# Patient Record
Sex: Female | Born: 1970 | Race: White | Hispanic: No | Marital: Married | Smoking: Never smoker
Health system: Southern US, Community
[De-identification: ages and names within clinical notes are randomized; demographics above are authoritative.]

## PROBLEM LIST (undated history)

## (undated) DIAGNOSIS — J309 Allergic rhinitis, unspecified: Secondary | ICD-10-CM

## (undated) DIAGNOSIS — J45909 Unspecified asthma, uncomplicated: Secondary | ICD-10-CM

## (undated) DIAGNOSIS — F419 Anxiety disorder, unspecified: Secondary | ICD-10-CM

## (undated) HISTORY — DX: Anxiety disorder, unspecified: F41.9

## (undated) HISTORY — DX: Allergic rhinitis, unspecified: J30.9

---

## 2003-09-05 HISTORY — PX: CHOLECYSTECTOMY: SHX55

## 2011-09-05 HISTORY — PX: OTHER SURGICAL HISTORY: SHX169

## 2018-07-04 ENCOUNTER — Encounter: Payer: Self-pay | Admitting: Allergy

## 2018-07-04 ENCOUNTER — Ambulatory Visit: Payer: BC Managed Care – PPO | Admitting: Allergy

## 2018-07-04 VITALS — BP 100/60 | HR 78 | Temp 97.8°F | Resp 16 | Ht 67.0 in | Wt 135.6 lb

## 2018-07-04 DIAGNOSIS — H1013 Acute atopic conjunctivitis, bilateral: Secondary | ICD-10-CM | POA: Diagnosis not present

## 2018-07-04 DIAGNOSIS — J988 Other specified respiratory disorders: Secondary | ICD-10-CM | POA: Diagnosis not present

## 2018-07-04 DIAGNOSIS — J3089 Other allergic rhinitis: Secondary | ICD-10-CM

## 2018-07-04 DIAGNOSIS — J4520 Mild intermittent asthma, uncomplicated: Secondary | ICD-10-CM | POA: Diagnosis not present

## 2018-07-04 MED ORDER — FLUTICASONE PROPIONATE 93 MCG/ACT NA EXHU: 2.0000 | INHALANT_SUSPENSION | Freq: Two times a day (BID) | NASAL | 5 refills | Status: DC

## 2018-07-04 MED ORDER — ALBUTEROL SULFATE HFA 108 (90 BASE) MCG/ACT IN AERS: 2.0000 | INHALATION_SPRAY | RESPIRATORY_TRACT | 1 refills | Status: DC | PRN

## 2018-07-04 MED ORDER — OLOPATADINE HCL 0.7 % OP SOLN: 1.0000 [drp] | Freq: Every day | OPHTHALMIC | 5 refills | Status: DC

## 2018-07-04 NOTE — Progress Notes (Signed)
New Patient Note  RE: Kelsey Benjamin MRN: [id number] DOB: Jan 19, 1971 Date of Office Visit: 07/04/2018  Referring provider: No ref. provider found Primary care provider: Isabella Bowens, PA-C  Chief Complaint: allergies  History of present illness: Kelsey Benjamin is a 47 y.o. female presenting today for evaluation of allergies.  She is former patient of our practice with last visit in 2008.  She returns today to re-establish care and discuss options of restarting immunotherapy.    She was seeing and ENT here in [place] and was undergoing allergy shots for about 3 years.  She states she was advised she could stop after 3 years (Sept 2018) and she states symptoms have been worse since she stopped immunotherapy.   She states symptoms were controlled while on immunotherapy and medications.   She reports symptoms of itchy/watery eyes, runny/stuffy nose and sneezing. She also reports having itchy rash mostly on her neck that is associated with her allergies.  Symptoms are year-round.  She had been taking Zyrtec for the past several years and believes it was no longer working.  She has tried Careers adviser and claritin in the past as well without much benefit. She has been on singulair in the past with last use around 1.5-2 years ago.   Has used flonase with some relief of nasal symptoms.  Also has used Pataday. She states the medications were not very helpful alone but combined with immunotherapy was effective.   She reports she "stays sick" with sinus infections and ear infections that she usually has at the same time.  She feels she would get these infections about 3 times a year at least that is treated with 1 round of antibiotic.  She has required prednisone with several of the infections.  Denies history of PNA, no skin infections, no opportunistic infections or needed for IV antibiotics.     She has used proair in the past for respiratory illnesses.  She states with most recent illness last week  did need to use albuterol for cough and wheezing.  Albuterol does help.  Denies symptoms inbetween illnesses.  She has never been diagnosed with asthma.    No history of eczema or food allergy.    Review of systems: Review of Systems  Constitutional: Negative for chills, fever and malaise/fatigue.  HENT: Positive for congestion. Negative for ear discharge, ear pain, nosebleeds, sinus pain and sore throat.   Eyes: Negative for pain, discharge and redness.  Respiratory: Positive for cough and wheezing. Negative for sputum production and shortness of breath.   Cardiovascular: Negative for chest pain.  Gastrointestinal: Negative for abdominal pain, constipation, diarrhea, heartburn, nausea and vomiting.  Musculoskeletal: Negative for joint pain.  Skin: Positive for itching and rash.  Neurological: Negative for headaches.    All other systems negative unless noted above in HPI  Past medical history: Past Medical History:  Diagnosis Date  . Allergic rhinitis   . Anxiety     Past surgical history: Past Surgical History:  Procedure Laterality Date  . CHOLECYSTECTOMY  2005  . uterine ablation  2013    Family history:  Family History  Problem Relation Age of Onset  . Allergic rhinitis Son   . Asthma Son   . Angioedema Neg Hx   . Eczema Neg Hx   . Immunodeficiency Neg Hx   . Urticaria Neg Hx     Social history: Lives in a home with carpeting with electric heating and central cooling.  Dogs in the home.  No concern for water damage, mildew or roaches in the home.  She is a Runner, broadcasting/film/video.  Denies smoking history.    Medication List: Allergies as of 07/04/2018      Reactions   Doxycycline Rash      Medication List        Accurate as of 07/04/18  4:12 PM. Always use your most recent med list.          amoxicillin-clavulanate 875-125 MG tablet Commonly known as:  AUGMENTIN Take by mouth.   cetirizine 10 MG tablet Commonly known as:  ZYRTEC Take by mouth.   citalopram 40  MG tablet Commonly known as:  CELEXA Take by mouth.   fluticasone 50 MCG/ACT nasal spray Commonly known as:  FLONASE 2 sprays in each nostril daily x1 week then 1-2 sprays in each nostril daily. (use lowest possible dose after week 1)   levocetirizine 5 MG tablet Commonly known as:  XYZAL Take by mouth.       Known medication allergies: Allergies  Allergen Reactions  . Doxycycline Rash     Physical examination: Blood pressure 100/60, pulse 78, temperature 97.8 F (36.6 C), temperature source Oral, resp. rate 16, height 5\' 7"  (1.702 m), weight 135 lb 9.3 oz (61.5 kg), SpO2 95 %.  General: Alert, interactive, in no acute distress. HEENT: PERRLA, TMs pearly gray, turbinates moderately edematous with clear discharge, post-pharynx non erythematous. Neck: Supple without lymphadenopathy. Lungs: Clear to auscultation without wheezing, rhonchi or rales. {no increased work of breathing. CV: Normal S1, S2 without murmurs. Abdomen: Nondistended, nontender. Skin: Warm and dry, without lesions or rashes. Extremities:  No clubbing, cyanosis or edema. Neuro:   Grossly intact.  Diagnositics/Labs: Labs: CBC and Differential (07/02/2018 10:58 AM EDT) Component Value Ref Range  WBC 6.9 4.8 - 10.8 x 10*3/uL  RBC 4.08 (L) 4.20 - 5.40 x 10*6/uL  Hemoglobin 13.0 12.0 - 16.0 G/DL  Hematocrit 82.9 56.2 - 47.0 %  MCV 93.3 81.0 - 99.0 FL  MCH 31.8 (H) 27.0 - 31.0 PG  MCHC 34.1 33.0 - 37.0 G/DL  RDW 13.0 86.5 - 78.4 %  Platelets 286 160 - 360 X 10*3/uL  MPV 6.6 (L) 6.8 - 10.2 FL  Neutrophil % 61 %  Lymphocyte % 28 %  Monocyte % 6 %  Eosinophil % 5 %  Basophil % 1 %  Neutrophil Absolute 4.2 1.6 - 7.3 x 10*3/uL  Lymphocyte Absolute 1.9 1.0 - 5.1 x 10*3/uL  Monocyte Absolute 0.4 0.1 - 0.9 x 10*3/uL  Eosinophil Absolute 0.3 0.0 - 0.5 x 10*3/uL  Basophil Absolute 0.0 0.0 - 0.2 x 10*3/uL   Comprehensive Metabolic Panel (07/02/2018 10:58 AM EDT) Component Value Ref Range  Sodium 139 135 -  146 MMOL/L  Potassium 3.9 3.5 - 5.3 MMOL/L  Chloride 104 98 - 110 MMOL/L  CO2 29 23 - 30 MMOL/L  BUN 11 8 - 24 MG/DL  Glucose 88 70 - 99 MG/DL  Creatinine 6.96 2.95 - 1.50 MG/DL  Calcium 9.0 8.5 - 28.4 MG/DL  Total Protein 6.4 6.0 - 8.3 G/DL  Albumin  4.0 3.5 - 5.0 G/DL  Total Bilirubin 0.5 0.1 - 1.2 MG/DL  Alkaline Phosphatase 62 25 - 125 IU/L or U/L  AST (SGOT) 10 5 - 40 IU/L or U/L  ALT (SGPT) 7 5 - 50 IU/L or U/L  Anion Gap 6 4 - 14 MMOL/L  Est. GFR Non-African American >=90Comment: GFR estimated by CKD-EPI equations, reportable up to 90 ML/MIN/1.73 M*2 >=60 ML/MIN/1.73 M*2  Est. GFR African American >=90Comment: GFR estimated by CKD-EPI equations, reportable up to 90 ML/MIN/1.73 M*2 >=60 ML/MIN/1.73 M*2     Spirometry: FEV1: 3.47L 109%, FVC: 4.04L 102%, ratio consistent with nonobstructive pattern   Environmental allergy skin prick testing is positive to grass and weed pollen, mold and dust mites.  Intradermal testing is negative.     Assessment and plan:   Allergies with dermatitis   - environmental allergy skin testing today is positive to grass pollen, weed pollen, mold and dust mites.     - will obtain environmental allergy to ensure we have captured all you are reactive too as she states previous testing was positive to additional allergens.  It is likely that her previous immunotherapy may have    - allergen avoidance measures discussed/handouts provided   - start Xyzal 5mg  daily   - for itchy/watery/red eyes try use of Pazeo 1 drop each eye daily as needed   - for nasal congestion recommend use of Xhance (fluticasone) nasal device.  This nasal spray allows for deeper deposition of medication into the sinuses to have better effect.  Believe this would be effective for you given history of sinus infections.    - would resume Singulair 10mg  daily (take at bedtime).   - allergen immunotherapy discussed today including protocol, benefits and risk.  Informational handout  provided.  If interested in this therapuetic option you can check with your insurance carrier for coverage.  Let us know if you would like to proceed with this option.  Last injections in [place] office given on Mon, Wed, Thurs 4:30; Tues 5:30 and Fri 3:30.      - we also discussed option of sublingual immunotherapy for grass, ragweed or dust mites.    Wheezing with illness   - likely asthmatic bronchitis   - have access to albuterol inhaler 2 puffs every 4-6 hours as needed for cough/wheeze/shortness of breath/chest tightness.  May use 15-20 minutes prior to activity.   Monitor frequency of use.    Recurrent sinopulmonary infections   - multiple episodes of sinus infections and ear infections per year   - will complete immunocompetence work-up with immunoglobulins and vaccine titers  Follow-up 4-6 months or sooner if needed  I appreciate the opportunity to take part in Renee's care. Please do not hesitate to contact me with questions.  Sincerely,   Margo Aye, MD Allergy/Immunology [facility]

## 2018-07-04 NOTE — Patient Instructions (Addendum)
Allergies with dermatitis   - environmental allergy skin testing today is positive to grass pollen, weed pollen, mold and dust mites.     - will obtain environmental allergy to ensure we have captured all you are reactive too as she states previous testing was positive to additional allergens.  It is likely that her previous immunotherapy may have    - allergen avoidance measures discussed/handouts provided   - start Xyzal 5mg  daily   - for itchy/watery/red eyes try use of Pazeo 1 drop each eye daily as needed   - for nasal congestion recommend use of Xhance (fluticasone) nasal device.  This nasal spray allows for deeper deposition of medication into the sinuses to have better effect.  Believe this would be effective for you given history of sinus infections.    - would resume Singulair 10mg  daily (take at bedtime).   - allergen immunotherapy discussed today including protocol, benefits and risk.  Informational handout provided.  If interested in this therapuetic option you can check with your insurance carrier for coverage.  Let us know if you would like to proceed with this option.  Last injections in [place] office given on Mon, Wed, Thurs 4:30; Tues 5:30 and Fri 3:30.      - we also discussed option of sublingual immunotherapy for grass, ragweed or dust mites.    Wheezing with illness   - likely asthmatic bronchitis   - have access to albuterol inhaler 2 puffs every 4-6 hours as needed for cough/wheeze/shortness of breath/chest tightness.  May use 15-20 minutes prior to activity.   Monitor frequency of use.    Recurrent sinopulmonary infections   - multiple episodes of sinus infections and ear infections per year   - will complete immunocompetence work-up with immunoglobulins and vaccine titers  Follow-up 4-6 months or sooner if needed

## 2018-07-04 NOTE — Addendum Note (Signed)
Addended by: Vincent Peyer A on: 07/04/2018 04:33 PM   Modules accepted: Orders

## 2018-07-18 ENCOUNTER — Telehealth: Payer: Self-pay

## 2018-07-18 MED ORDER — EPINEPHRINE 0.3 MG/0.3ML IJ SOAJ: INTRAMUSCULAR | 1 refills | Status: DC

## 2018-07-18 NOTE — Telephone Encounter (Signed)
Spoke to pt. About her blood work. She states she can't do the zone 2 that test is 200.00 dollars or the IA-INF-Qnt nos AB-Qnt-nos and this test is 377.00. The other blood work is more reasonable, but Dr. Delorse LekPadgett said she could hold off on the blood work, but when she meets her deductible the immuno blood work up she would like pt. To get. I will inform the pt. To keep track of any infections and antibiotics she has taken. Per Dr. Delorse LekPadgett. Pt. Is scheduled for immunotherapy on December 11th at 4:00 pm.

## 2018-07-18 NOTE — Telephone Encounter (Signed)
Pt. Calling to let us know that her blood work was going to cost her 653.00. So the pt. Did not get her blood work done. Please advise.

## 2018-07-18 NOTE — Telephone Encounter (Signed)
Ok.  Her insurance must not have kicked in anything.  Her labwork consisted of environmental panel, vaccine titers, immunoglobin and cbc.

## 2018-07-23 NOTE — Addendum Note (Signed)
Addended by: Lorrin MaisPADGETT, SHAYLAR P on: 07/23/2018 08:39 AM   Modules accepted: Orders

## 2018-07-23 NOTE — Progress Notes (Signed)
VIALS EXP 07-24-19

## 2018-07-26 DIAGNOSIS — J3089 Other allergic rhinitis: Secondary | ICD-10-CM | POA: Diagnosis not present

## 2018-08-14 ENCOUNTER — Ambulatory Visit: Payer: Self-pay

## 2018-08-15 ENCOUNTER — Ambulatory Visit (INDEPENDENT_AMBULATORY_CARE_PROVIDER_SITE_OTHER): Payer: BC Managed Care – PPO | Admitting: *Deleted

## 2018-08-15 DIAGNOSIS — J309 Allergic rhinitis, unspecified: Secondary | ICD-10-CM | POA: Diagnosis not present

## 2018-08-15 NOTE — Progress Notes (Signed)
Immunotherapy   Patient Details  Name: Kelsey GoldmannDanette Benjamin MRN: [id number] Date of Birth: 08/15/1971  08/15/2018  Kelsey Goldmannanette Benjamin started injections for  blue vial pollen-dustmite Following schedule: B  Frequency:2 times per week Epi-Pen:Epi-Pen Available  Consent signed and patient instructions given.   Maurine SimmeringLogan D Freeman 08/15/2018, 3:02 PM

## 2018-08-22 ENCOUNTER — Ambulatory Visit (INDEPENDENT_AMBULATORY_CARE_PROVIDER_SITE_OTHER): Payer: BC Managed Care – PPO

## 2018-08-22 DIAGNOSIS — J309 Allergic rhinitis, unspecified: Secondary | ICD-10-CM | POA: Diagnosis not present

## 2018-08-26 ENCOUNTER — Ambulatory Visit (INDEPENDENT_AMBULATORY_CARE_PROVIDER_SITE_OTHER): Payer: BC Managed Care – PPO

## 2018-08-26 DIAGNOSIS — J309 Allergic rhinitis, unspecified: Secondary | ICD-10-CM

## 2018-08-30 ENCOUNTER — Other Ambulatory Visit: Payer: Self-pay | Admitting: Allergy

## 2018-08-30 ENCOUNTER — Ambulatory Visit (INDEPENDENT_AMBULATORY_CARE_PROVIDER_SITE_OTHER): Payer: BC Managed Care – PPO

## 2018-08-30 DIAGNOSIS — J309 Allergic rhinitis, unspecified: Secondary | ICD-10-CM | POA: Diagnosis not present

## 2018-08-30 MED ORDER — MONTELUKAST SODIUM 10 MG PO TABS: 10.0000 mg | ORAL_TABLET | Freq: Every day | ORAL | 5 refills | Status: DC

## 2018-08-30 NOTE — Telephone Encounter (Signed)
Patient came in office today to tell us that Dr Delorse LekPadgett did not send the prescription for singulair like she said she would from the office visit.

## 2018-08-30 NOTE — Telephone Encounter (Signed)
Refill for Singulair sent to [facility]

## 2018-09-03 ENCOUNTER — Ambulatory Visit (INDEPENDENT_AMBULATORY_CARE_PROVIDER_SITE_OTHER): Payer: BC Managed Care – PPO

## 2018-09-03 DIAGNOSIS — J309 Allergic rhinitis, unspecified: Secondary | ICD-10-CM

## 2018-09-12 ENCOUNTER — Ambulatory Visit (INDEPENDENT_AMBULATORY_CARE_PROVIDER_SITE_OTHER): Payer: BC Managed Care – PPO

## 2018-09-12 DIAGNOSIS — J309 Allergic rhinitis, unspecified: Secondary | ICD-10-CM | POA: Diagnosis not present

## 2018-09-18 ENCOUNTER — Ambulatory Visit (INDEPENDENT_AMBULATORY_CARE_PROVIDER_SITE_OTHER): Payer: BC Managed Care – PPO | Admitting: *Deleted

## 2018-09-18 DIAGNOSIS — J309 Allergic rhinitis, unspecified: Secondary | ICD-10-CM

## 2018-10-02 ENCOUNTER — Ambulatory Visit (INDEPENDENT_AMBULATORY_CARE_PROVIDER_SITE_OTHER): Payer: BC Managed Care – PPO | Admitting: *Deleted

## 2018-10-02 DIAGNOSIS — J309 Allergic rhinitis, unspecified: Secondary | ICD-10-CM

## 2018-10-07 ENCOUNTER — Other Ambulatory Visit: Payer: Self-pay | Admitting: Allergy

## 2018-10-07 MED ORDER — LEVOCETIRIZINE DIHYDROCHLORIDE 5 MG PO TABS: 5.0000 mg | ORAL_TABLET | Freq: Every evening | ORAL | 5 refills | Status: DC

## 2018-10-07 NOTE — Telephone Encounter (Signed)
PT request refill of xyzal

## 2018-10-07 NOTE — Addendum Note (Signed)
Addended by: Maryjean Morn D on: 10/07/2018 01:54 PM   Modules accepted: Orders

## 2018-10-07 NOTE — Telephone Encounter (Signed)
Medication [facility]

## 2018-10-11 ENCOUNTER — Ambulatory Visit (INDEPENDENT_AMBULATORY_CARE_PROVIDER_SITE_OTHER): Payer: BC Managed Care – PPO

## 2018-10-11 DIAGNOSIS — J309 Allergic rhinitis, unspecified: Secondary | ICD-10-CM | POA: Diagnosis not present

## 2018-10-24 ENCOUNTER — Ambulatory Visit (INDEPENDENT_AMBULATORY_CARE_PROVIDER_SITE_OTHER): Payer: BC Managed Care – PPO

## 2018-10-24 DIAGNOSIS — J309 Allergic rhinitis, unspecified: Secondary | ICD-10-CM

## 2018-10-31 ENCOUNTER — Ambulatory Visit (INDEPENDENT_AMBULATORY_CARE_PROVIDER_SITE_OTHER): Payer: BC Managed Care – PPO

## 2018-10-31 DIAGNOSIS — J309 Allergic rhinitis, unspecified: Secondary | ICD-10-CM

## 2018-11-04 ENCOUNTER — Ambulatory Visit (INDEPENDENT_AMBULATORY_CARE_PROVIDER_SITE_OTHER): Payer: BC Managed Care – PPO

## 2018-11-04 DIAGNOSIS — J309 Allergic rhinitis, unspecified: Secondary | ICD-10-CM | POA: Diagnosis not present

## 2018-11-13 ENCOUNTER — Ambulatory Visit (INDEPENDENT_AMBULATORY_CARE_PROVIDER_SITE_OTHER): Payer: BC Managed Care – PPO

## 2018-11-13 DIAGNOSIS — J309 Allergic rhinitis, unspecified: Secondary | ICD-10-CM

## 2018-11-19 ENCOUNTER — Ambulatory Visit (INDEPENDENT_AMBULATORY_CARE_PROVIDER_SITE_OTHER): Payer: BC Managed Care – PPO

## 2018-11-19 DIAGNOSIS — J309 Allergic rhinitis, unspecified: Secondary | ICD-10-CM | POA: Diagnosis not present

## 2018-11-26 ENCOUNTER — Ambulatory Visit (INDEPENDENT_AMBULATORY_CARE_PROVIDER_SITE_OTHER): Payer: BC Managed Care – PPO | Admitting: *Deleted

## 2018-11-26 DIAGNOSIS — J309 Allergic rhinitis, unspecified: Secondary | ICD-10-CM | POA: Diagnosis not present

## 2018-12-02 ENCOUNTER — Other Ambulatory Visit: Payer: Self-pay | Admitting: Allergy

## 2018-12-04 ENCOUNTER — Ambulatory Visit (INDEPENDENT_AMBULATORY_CARE_PROVIDER_SITE_OTHER): Payer: BC Managed Care – PPO

## 2018-12-04 DIAGNOSIS — J309 Allergic rhinitis, unspecified: Secondary | ICD-10-CM | POA: Diagnosis not present

## 2018-12-12 ENCOUNTER — Ambulatory Visit (INDEPENDENT_AMBULATORY_CARE_PROVIDER_SITE_OTHER): Payer: BC Managed Care – PPO

## 2018-12-12 DIAGNOSIS — J309 Allergic rhinitis, unspecified: Secondary | ICD-10-CM

## 2018-12-19 ENCOUNTER — Ambulatory Visit (INDEPENDENT_AMBULATORY_CARE_PROVIDER_SITE_OTHER): Payer: BC Managed Care – PPO

## 2018-12-19 DIAGNOSIS — J309 Allergic rhinitis, unspecified: Secondary | ICD-10-CM

## 2018-12-26 ENCOUNTER — Ambulatory Visit (INDEPENDENT_AMBULATORY_CARE_PROVIDER_SITE_OTHER): Payer: BC Managed Care – PPO

## 2018-12-26 DIAGNOSIS — J309 Allergic rhinitis, unspecified: Secondary | ICD-10-CM | POA: Diagnosis not present

## 2019-01-02 ENCOUNTER — Ambulatory Visit (INDEPENDENT_AMBULATORY_CARE_PROVIDER_SITE_OTHER): Payer: BC Managed Care – PPO

## 2019-01-02 DIAGNOSIS — J309 Allergic rhinitis, unspecified: Secondary | ICD-10-CM

## 2019-01-07 ENCOUNTER — Ambulatory Visit (INDEPENDENT_AMBULATORY_CARE_PROVIDER_SITE_OTHER): Payer: BC Managed Care – PPO

## 2019-01-07 DIAGNOSIS — J309 Allergic rhinitis, unspecified: Secondary | ICD-10-CM | POA: Diagnosis not present

## 2019-01-16 ENCOUNTER — Ambulatory Visit (INDEPENDENT_AMBULATORY_CARE_PROVIDER_SITE_OTHER): Payer: BC Managed Care – PPO

## 2019-01-16 DIAGNOSIS — J309 Allergic rhinitis, unspecified: Secondary | ICD-10-CM | POA: Diagnosis not present

## 2019-01-22 ENCOUNTER — Ambulatory Visit (INDEPENDENT_AMBULATORY_CARE_PROVIDER_SITE_OTHER): Payer: BC Managed Care – PPO

## 2019-01-22 DIAGNOSIS — J309 Allergic rhinitis, unspecified: Secondary | ICD-10-CM

## 2019-01-29 ENCOUNTER — Ambulatory Visit (INDEPENDENT_AMBULATORY_CARE_PROVIDER_SITE_OTHER): Payer: BC Managed Care – PPO

## 2019-01-29 DIAGNOSIS — J309 Allergic rhinitis, unspecified: Secondary | ICD-10-CM | POA: Diagnosis not present

## 2019-02-05 ENCOUNTER — Ambulatory Visit (INDEPENDENT_AMBULATORY_CARE_PROVIDER_SITE_OTHER): Payer: BC Managed Care – PPO

## 2019-02-05 DIAGNOSIS — J309 Allergic rhinitis, unspecified: Secondary | ICD-10-CM | POA: Diagnosis not present

## 2019-02-13 ENCOUNTER — Ambulatory Visit (INDEPENDENT_AMBULATORY_CARE_PROVIDER_SITE_OTHER): Payer: BC Managed Care – PPO

## 2019-02-13 DIAGNOSIS — J309 Allergic rhinitis, unspecified: Secondary | ICD-10-CM

## 2019-02-18 ENCOUNTER — Other Ambulatory Visit: Payer: Self-pay | Admitting: Allergy

## 2019-02-19 ENCOUNTER — Ambulatory Visit (INDEPENDENT_AMBULATORY_CARE_PROVIDER_SITE_OTHER): Payer: BC Managed Care – PPO

## 2019-02-19 DIAGNOSIS — J309 Allergic rhinitis, unspecified: Secondary | ICD-10-CM

## 2019-02-20 ENCOUNTER — Other Ambulatory Visit: Payer: Self-pay | Admitting: *Deleted

## 2019-02-24 ENCOUNTER — Ambulatory Visit (INDEPENDENT_AMBULATORY_CARE_PROVIDER_SITE_OTHER): Payer: BC Managed Care – PPO

## 2019-02-24 DIAGNOSIS — J309 Allergic rhinitis, unspecified: Secondary | ICD-10-CM

## 2019-02-26 DIAGNOSIS — J3089 Other allergic rhinitis: Secondary | ICD-10-CM | POA: Diagnosis not present

## 2019-03-04 ENCOUNTER — Ambulatory Visit (INDEPENDENT_AMBULATORY_CARE_PROVIDER_SITE_OTHER): Payer: BC Managed Care – PPO

## 2019-03-04 DIAGNOSIS — J309 Allergic rhinitis, unspecified: Secondary | ICD-10-CM

## 2019-03-12 ENCOUNTER — Ambulatory Visit (INDEPENDENT_AMBULATORY_CARE_PROVIDER_SITE_OTHER): Payer: BC Managed Care – PPO

## 2019-03-12 DIAGNOSIS — J309 Allergic rhinitis, unspecified: Secondary | ICD-10-CM

## 2019-03-21 ENCOUNTER — Ambulatory Visit (INDEPENDENT_AMBULATORY_CARE_PROVIDER_SITE_OTHER): Payer: BC Managed Care – PPO

## 2019-03-21 DIAGNOSIS — J309 Allergic rhinitis, unspecified: Secondary | ICD-10-CM

## 2019-03-26 ENCOUNTER — Ambulatory Visit (INDEPENDENT_AMBULATORY_CARE_PROVIDER_SITE_OTHER): Payer: BC Managed Care – PPO

## 2019-03-26 DIAGNOSIS — J309 Allergic rhinitis, unspecified: Secondary | ICD-10-CM | POA: Diagnosis not present

## 2019-03-29 ENCOUNTER — Other Ambulatory Visit: Payer: Self-pay | Admitting: Allergy

## 2019-04-01 ENCOUNTER — Ambulatory Visit (INDEPENDENT_AMBULATORY_CARE_PROVIDER_SITE_OTHER): Payer: BC Managed Care – PPO

## 2019-04-01 ENCOUNTER — Other Ambulatory Visit: Payer: Self-pay | Admitting: Allergy

## 2019-04-01 DIAGNOSIS — J309 Allergic rhinitis, unspecified: Secondary | ICD-10-CM

## 2019-04-01 MED ORDER — LEVOCETIRIZINE DIHYDROCHLORIDE 5 MG PO TABS: 5.0000 mg | ORAL_TABLET | Freq: Every evening | ORAL | 0 refills | Status: DC

## 2019-04-01 NOTE — Telephone Encounter (Signed)
PT needs refill of Xyzal sent to same pharmacy.

## 2019-04-01 NOTE — Telephone Encounter (Signed)
Gave 1 refill of levocetirizine- scheduled apt for 04/10/19 with Dr. Nelva Bush

## 2019-04-10 ENCOUNTER — Ambulatory Visit: Payer: BC Managed Care – PPO | Admitting: Allergy

## 2019-04-10 ENCOUNTER — Ambulatory Visit: Payer: Self-pay

## 2019-04-10 ENCOUNTER — Encounter: Payer: Self-pay | Admitting: Allergy

## 2019-04-10 ENCOUNTER — Other Ambulatory Visit: Payer: Self-pay

## 2019-04-10 VITALS — BP 118/64 | HR 81 | Temp 98.2°F | Resp 12

## 2019-04-10 DIAGNOSIS — J4520 Mild intermittent asthma, uncomplicated: Secondary | ICD-10-CM

## 2019-04-10 DIAGNOSIS — J3089 Other allergic rhinitis: Secondary | ICD-10-CM

## 2019-04-10 DIAGNOSIS — J988 Other specified respiratory disorders: Secondary | ICD-10-CM | POA: Diagnosis not present

## 2019-04-10 DIAGNOSIS — H1013 Acute atopic conjunctivitis, bilateral: Secondary | ICD-10-CM | POA: Diagnosis not present

## 2019-04-10 DIAGNOSIS — J309 Allergic rhinitis, unspecified: Secondary | ICD-10-CM

## 2019-04-10 MED ORDER — LEVOCETIRIZINE DIHYDROCHLORIDE 5 MG PO TABS: 5.0000 mg | ORAL_TABLET | Freq: Every evening | ORAL | 11 refills | Status: DC

## 2019-04-10 MED ORDER — XHANCE 93 MCG/ACT NA EXHU: 2.0000 | INHALANT_SUSPENSION | Freq: Two times a day (BID) | NASAL | 11 refills | Status: DC

## 2019-04-10 MED ORDER — AZELASTINE HCL 0.15 % NA SOLN: 2.0000 | Freq: Two times a day (BID) | NASAL | 11 refills | Status: DC

## 2019-04-10 NOTE — Patient Instructions (Addendum)
Allergies with dermatitis   - continue avoidance measures for grass pollen, weed pollen, mold and dust mites.     - continue Xyzal 5mg  daily   - for itchy/watery/red eyes try use of Pazeo 1 drop each eye daily as needed   - for nasal congestion continue use of Xhance (fluticasone) nasal device.  This nasal spray allows for deeper deposition of medication into the sinuses to have better effect.   - for nasal drainage/post-nasal drip recommend use of nasal antihistamine, Astelin 2 sprays each nostril 1-2 times a day (use definitely before bedtime)  - Singulair caused side effects and thus will not use again  - continue allergen immunotherapy (allergy shots) per schedule  Wheezing with illness   - asthmatic bronchitis   - have access to albuterol inhaler 2 puffs every 4-6 hours as needed for cough/wheeze/shortness of breath/chest tightness.  May use 15-20 minutes prior to activity.   Monitor frequency of use.    Recurrent sinopulmonary infections   - improved since starting immunotherapy!  Follow-up 12 months or sooner if needed

## 2019-04-10 NOTE — Progress Notes (Signed)
Follow-up Note  RE: Kelsey GoldmannDanette Benjamin MRN: [id number] DOB: [birth year] Date of Office Visit: 04/10/2019   History of present illness: Kelsey Benjamin is a 48 y.o. female presenting today for follow-up of allergic rhinitis with conjunctivitis and dermatitis, no wheezing and recurrent sinopulmonary infections.  She was last seen in the office on July 04, 2018 by myself.  Since this visit she was started on allergen immunotherapy and is doing well without any large local or systemic reactions.  She does have access to an epinephrine device.  She states since resuming allergen immunotherapy that she has not had any sinus infections or antibiotic needs.  She also has noted a reduction in her symptoms.  She is taking Xyzal daily and states that this works the best for her out of the antihistamines.  She also is using X hands and states that this works well for her nasal congestion.  However she states she has been having nightly postnasal drip and throat clearing.  She does have Pazeo but has not needed to use this. She has not had any respiratory illnesses and thus has not had any wheezing or need to use her albuterol.   Review of systems: Review of Systems  Constitutional: Negative for chills, fever and malaise/fatigue.  HENT: Negative for congestion, ear discharge, nosebleeds and sore throat.   Eyes: Negative for pain, discharge and redness.  Respiratory: Negative for cough, shortness of breath and wheezing.   Cardiovascular: Negative for chest pain.  Gastrointestinal: Negative for abdominal pain, constipation, diarrhea, heartburn, nausea and vomiting.  Musculoskeletal: Negative for joint pain.  Skin: Negative for itching and rash.  Neurological: Negative for headaches.    All other systems negative unless noted above in HPI  Past medical/social/surgical/family history have been reviewed and are unchanged unless specifically indicated below.  No changes  Medication List:  Allergies as of 04/10/2019      Reactions   Doxycycline Rash      Medication List       Accurate as of April 10, 2019  4:31 PM. If you have any questions, ask your nurse or doctor.        STOP taking these medications   cetirizine 10 MG tablet Commonly known as: ZYRTEC Stopped by: Yarisbel Miranda Larose HiresPatricia Grainne Knights, MD     TAKE these medications   albuterol 108 (90 Base) MCG/ACT inhaler Commonly known as: VENTOLIN HFA INHALE 2 PUFFS INTO THE LUNGS EVERY 4 (FOUR) HOURS AS NEEDED FOR WHEEZING OR SHORTNESS OF BREATH.   Azelastine HCl 0.15 % Soln Place 2 sprays into both nostrils 2 (two) times daily. Started by: Dynastee Brummell Larose HiresPatricia Melaine Mcphee, MD   citalopram 40 MG tablet Commonly known as: CELEXA Take by mouth.   EPINEPHrine 0.3 mg/0.3 mL Soaj injection Commonly known as: Auvi-Q Use as directed for severe allergic reactions.   levocetirizine 5 MG tablet Commonly known as: XYZAL Take 1 tablet (5 mg total) by mouth every evening.   montelukast 10 MG tablet Commonly known as: Singulair Take 1 tablet (10 mg total) by mouth at bedtime.   Olopatadine HCl 0.7 % Soln Commonly known as: Pazeo Place 1 drop into both eyes daily.   Xhance 93 MCG/ACT Exhu Generic drug: Fluticasone Propionate Place 2 puffs into the nose 2 (two) times daily.       Known medication allergies: Allergies  Allergen Reactions  . Doxycycline Rash     Physical examination: There were no vitals taken for this visit.  General: Alert, interactive, in no acute distress.  HEENT: PERRLA, TMs pearly gray, turbinates non-edematous without discharge, post-pharynx non erythematous. Neck: Supple without lymphadenopathy. Lungs: Clear to auscultation without wheezing, rhonchi or rales. {no increased work of breathing. CV: Normal S1, S2 without murmurs. Abdomen: Nondistended, nontender. Skin: Warm and dry, without lesions or rashes. Extremities:  No clubbing, cyanosis or edema. Neuro:   Grossly intact.   Diagnositics/Labs:  Spirometry: FEV1: 3.57L 112%, FVC: 4.42L 111%, ratio consistent with Nonobstructive pattern  Assessment and plan:   Allergies with dermatitis   - continue avoidance measures for grass pollen, weed pollen, mold and dust mites.     - continue Xyzal 5mg  daily   - for itchy/watery/red eyes try use of Pazeo 1 drop each eye daily as needed   - for nasal congestion continue use of Xhance (fluticasone) nasal device.  This nasal spray allows for deeper deposition of medication into the sinuses to have better effect.   - for nasal drainage/post-nasal drip recommend use of nasal antihistamine, Astelin 2 sprays each nostril 1-2 times a day (use definitely before bedtime)  - Singulair caused side effects and thus will not use again  - continue allergen immunotherapy (allergy shots) per schedule  Wheezing with illness   - asthmatic bronchitis   - have access to albuterol inhaler 2 puffs every 4-6 hours as needed for cough/wheeze/shortness of breath/chest tightness.  May use 15-20 minutes prior to activity.   Monitor frequency of use.    Recurrent sinopulmonary infections   - improved since starting immunotherapy!  Follow-up 12 months or sooner if needed I appreciate the opportunity to take part in Kelsey Benjamin's care. Please do not hesitate to contact me with questions.  Sincerely,   Prudy Feeler, MD Allergy/Immunology [facility]

## 2019-04-11 NOTE — Addendum Note (Signed)
Addended by: Katherina Right D on: 04/11/2019 10:51 AM   Modules accepted: Orders

## 2019-04-16 ENCOUNTER — Ambulatory Visit (INDEPENDENT_AMBULATORY_CARE_PROVIDER_SITE_OTHER): Payer: BC Managed Care – PPO

## 2019-04-16 DIAGNOSIS — J309 Allergic rhinitis, unspecified: Secondary | ICD-10-CM | POA: Diagnosis not present

## 2019-04-24 ENCOUNTER — Ambulatory Visit (INDEPENDENT_AMBULATORY_CARE_PROVIDER_SITE_OTHER): Payer: BC Managed Care – PPO

## 2019-04-24 DIAGNOSIS — J309 Allergic rhinitis, unspecified: Secondary | ICD-10-CM

## 2019-04-29 ENCOUNTER — Ambulatory Visit (INDEPENDENT_AMBULATORY_CARE_PROVIDER_SITE_OTHER): Payer: BC Managed Care – PPO

## 2019-04-29 DIAGNOSIS — J309 Allergic rhinitis, unspecified: Secondary | ICD-10-CM

## 2019-05-07 ENCOUNTER — Ambulatory Visit (INDEPENDENT_AMBULATORY_CARE_PROVIDER_SITE_OTHER): Payer: No Typology Code available for payment source

## 2019-05-07 DIAGNOSIS — J309 Allergic rhinitis, unspecified: Secondary | ICD-10-CM

## 2019-05-13 ENCOUNTER — Ambulatory Visit (INDEPENDENT_AMBULATORY_CARE_PROVIDER_SITE_OTHER): Payer: No Typology Code available for payment source

## 2019-05-13 DIAGNOSIS — J309 Allergic rhinitis, unspecified: Secondary | ICD-10-CM

## 2019-05-14 ENCOUNTER — Other Ambulatory Visit: Payer: Self-pay

## 2019-05-14 MED ORDER — LEVOCETIRIZINE DIHYDROCHLORIDE 5 MG PO TABS: 5.0000 mg | ORAL_TABLET | Freq: Every evening | ORAL | 3 refills | Status: DC

## 2019-05-19 ENCOUNTER — Ambulatory Visit (INDEPENDENT_AMBULATORY_CARE_PROVIDER_SITE_OTHER): Payer: No Typology Code available for payment source

## 2019-05-19 DIAGNOSIS — J309 Allergic rhinitis, unspecified: Secondary | ICD-10-CM

## 2019-05-28 ENCOUNTER — Ambulatory Visit (INDEPENDENT_AMBULATORY_CARE_PROVIDER_SITE_OTHER): Payer: No Typology Code available for payment source

## 2019-05-28 DIAGNOSIS — J309 Allergic rhinitis, unspecified: Secondary | ICD-10-CM | POA: Diagnosis not present

## 2019-06-03 NOTE — Progress Notes (Addendum)
Vial exp 06-02-20.  Reprint label

## 2019-06-04 ENCOUNTER — Ambulatory Visit (INDEPENDENT_AMBULATORY_CARE_PROVIDER_SITE_OTHER): Payer: No Typology Code available for payment source

## 2019-06-04 DIAGNOSIS — J309 Allergic rhinitis, unspecified: Secondary | ICD-10-CM

## 2019-06-05 DIAGNOSIS — J3089 Other allergic rhinitis: Secondary | ICD-10-CM

## 2019-06-11 ENCOUNTER — Ambulatory Visit (INDEPENDENT_AMBULATORY_CARE_PROVIDER_SITE_OTHER): Payer: No Typology Code available for payment source

## 2019-06-11 DIAGNOSIS — J309 Allergic rhinitis, unspecified: Secondary | ICD-10-CM

## 2019-06-23 ENCOUNTER — Ambulatory Visit (INDEPENDENT_AMBULATORY_CARE_PROVIDER_SITE_OTHER): Payer: No Typology Code available for payment source

## 2019-06-23 DIAGNOSIS — J309 Allergic rhinitis, unspecified: Secondary | ICD-10-CM

## 2019-06-30 ENCOUNTER — Other Ambulatory Visit: Payer: Self-pay | Admitting: Allergy

## 2019-07-02 ENCOUNTER — Ambulatory Visit (INDEPENDENT_AMBULATORY_CARE_PROVIDER_SITE_OTHER): Payer: No Typology Code available for payment source

## 2019-07-02 DIAGNOSIS — J309 Allergic rhinitis, unspecified: Secondary | ICD-10-CM | POA: Diagnosis not present

## 2019-07-08 ENCOUNTER — Ambulatory Visit (INDEPENDENT_AMBULATORY_CARE_PROVIDER_SITE_OTHER): Payer: No Typology Code available for payment source

## 2019-07-08 DIAGNOSIS — J309 Allergic rhinitis, unspecified: Secondary | ICD-10-CM | POA: Diagnosis not present

## 2019-07-16 ENCOUNTER — Ambulatory Visit (INDEPENDENT_AMBULATORY_CARE_PROVIDER_SITE_OTHER): Payer: No Typology Code available for payment source

## 2019-07-16 DIAGNOSIS — J309 Allergic rhinitis, unspecified: Secondary | ICD-10-CM | POA: Diagnosis not present

## 2019-07-23 ENCOUNTER — Ambulatory Visit (INDEPENDENT_AMBULATORY_CARE_PROVIDER_SITE_OTHER): Payer: No Typology Code available for payment source

## 2019-07-23 DIAGNOSIS — J309 Allergic rhinitis, unspecified: Secondary | ICD-10-CM

## 2019-08-05 ENCOUNTER — Ambulatory Visit (INDEPENDENT_AMBULATORY_CARE_PROVIDER_SITE_OTHER): Payer: No Typology Code available for payment source

## 2019-08-05 ENCOUNTER — Other Ambulatory Visit: Payer: Self-pay | Admitting: Allergy

## 2019-08-05 DIAGNOSIS — J309 Allergic rhinitis, unspecified: Secondary | ICD-10-CM | POA: Diagnosis not present

## 2019-08-05 MED ORDER — MONTELUKAST SODIUM 10 MG PO TABS: 10.0000 mg | ORAL_TABLET | Freq: Every day | ORAL | 5 refills | Status: DC

## 2019-08-05 NOTE — Telephone Encounter (Signed)
PT called to get refill of montelukast [facility] [place]

## 2019-08-05 NOTE — Telephone Encounter (Signed)
rx sent

## 2019-08-13 ENCOUNTER — Ambulatory Visit (INDEPENDENT_AMBULATORY_CARE_PROVIDER_SITE_OTHER): Payer: No Typology Code available for payment source

## 2019-08-13 DIAGNOSIS — J309 Allergic rhinitis, unspecified: Secondary | ICD-10-CM | POA: Diagnosis not present

## 2019-08-20 ENCOUNTER — Ambulatory Visit (INDEPENDENT_AMBULATORY_CARE_PROVIDER_SITE_OTHER): Payer: No Typology Code available for payment source

## 2019-08-20 DIAGNOSIS — J309 Allergic rhinitis, unspecified: Secondary | ICD-10-CM

## 2019-08-27 ENCOUNTER — Ambulatory Visit (INDEPENDENT_AMBULATORY_CARE_PROVIDER_SITE_OTHER): Payer: No Typology Code available for payment source

## 2019-08-27 DIAGNOSIS — J309 Allergic rhinitis, unspecified: Secondary | ICD-10-CM | POA: Diagnosis not present

## 2019-09-10 ENCOUNTER — Ambulatory Visit (INDEPENDENT_AMBULATORY_CARE_PROVIDER_SITE_OTHER): Payer: No Typology Code available for payment source

## 2019-09-10 DIAGNOSIS — J309 Allergic rhinitis, unspecified: Secondary | ICD-10-CM

## 2019-09-10 NOTE — Progress Notes (Signed)
VIAL EXP 09-09-20 

## 2019-09-11 DIAGNOSIS — J3089 Other allergic rhinitis: Secondary | ICD-10-CM

## 2019-09-19 ENCOUNTER — Telehealth: Payer: Self-pay

## 2019-09-19 NOTE — Telephone Encounter (Signed)
Pt. Calling bc she has been on allergy injections for pollen-mite and she still is experiencing a constant runny nose,drainge in the back of her throat, headaches and sinus pressure behind her eyes. She has seen ENT Dr. Verne Spurr in the past. Pt. Also states that her asthma is being affected also. Made pt. An appointment to see Thermon Leyland FNP 01/21 at 10:00 am

## 2019-09-24 NOTE — Progress Notes (Addendum)
[address] [place] [address] Dept: [phone]  FOLLOW UP NOTE  Patient ID: Kelsey Benjamin, female    DOB: 1971-02-10  Age: 49 y.o. MRN: [id number] Date of Office Visit: 09/25/2019  Assessment  Chief Complaint: Asthma and Allergic Rhinitis   HPI Kelsey Benjamin is a 49 year old female who presents to the clinic for a follow up visit. She was last seen in this clinic on 04/10/2019 for evaluation of asthma, allergic rhinitis, allergic conjunctivitis, and atopic dermatitis.  At today's visit, she reports asthma has been moderately well controlled with occasional shortness of breath especially with activity, no wheeze, and occasional dry cough.  She reports that she is avoiding activity due to shortness of breath and cough.  She uses albuterol twice a day on somewhat of a regular schedule.  Allergic rhinitis is reported as not well controlled with symptoms including clear rhinorrhea, thick postnasal drainage, and throat clearing for which she is taking Xyzal 5 mg once a day using XHANCE twice a day, and azelastine twice a day.  She began allergen immunotherapy about a year ago and feels that this is somewhat helpful in reducing her symptoms however her symptoms persist on a daily basis with no seasonal variation.  Allergic rhinitis is well controlled with an over-the-counter antihistamine eyedrop.  Her current medications are listed in the chart.   Drug Allergies:  Allergies  Allergen Reactions  . Doxycycline Rash    Physical Exam: BP 122/78 (BP Location: Right Arm, Patient Position: Sitting, Cuff Size: Normal)   Pulse 98   Temp (!) 96.3 F (35.7 C) (Temporal)   Resp 17   Ht 5' 6.5" (1.689 m)   Wt 145 lb (65.8 kg)   SpO2 98%   BMI 23.05 kg/m    Physical Exam Vitals reviewed.  Constitutional:      Appearance: Normal appearance.  HENT:     Head: Normocephalic and atraumatic.     Right Ear: Tympanic membrane normal.     Left Ear: Tympanic membrane normal.   Nose:     Comments: Bilateral nares edematous and pale with clear nasal drainage noted.  Pharynx erythematous with no exudate noted.  Ears normal.  Eyes normal. Eyes:     Conjunctiva/sclera: Conjunctivae normal.  Cardiovascular:     Rate and Rhythm: Normal rate and regular rhythm.     Heart sounds: Normal heart sounds. No murmur.  Pulmonary:     Effort: Pulmonary effort is normal.     Breath sounds: Normal breath sounds.     Comments: Lungs clear to auscultation Musculoskeletal:        General: Normal range of motion.     Cervical back: Normal range of motion and neck supple.  Skin:    General: Skin is warm and dry.  Neurological:     Mental Status: She is alert and oriented to person, place, and time.  Psychiatric:        Mood and Affect: Mood normal.        Behavior: Behavior normal.        Thought Content: Thought content normal.        Judgment: Judgment normal.    Diagnostics: FVC 4.24, FEV1 3.50. Predicted FVC 3.96, FEV1 3.16. Spirometry indicates normal ventilatory function.   Assessment and Plan: 1. Moderate persistent asthma with acute exacerbation   2. Seasonal and perennial allergic rhinitis   3. Allergic conjunctivitis of both eyes     Meds ordered this encounter  Medications  .  fluticasone (FLOVENT HFA) 110 MCG/ACT inhaler    Sig: Two puffs with spacer twice a day.    Dispense:  1 Inhaler    Refill:  5  . ipratropium (ATROVENT) 0.06 % nasal spray    Sig: Two sprays each nostril twice a day as needed.    Dispense:  15 mL    Refill:  5    Patient Instructions  Asthma Begin Flovent 110-2 puffs twice a day with a spacer to prevent cough or wheeze Continue montelukast 10 mg once a day to prevent cough or wheeze Continue albuterol 2 puffs every 4 hours as needed for cough or wheeze  Allergic rhinitis Begin Atrovent nasal spray 2 sprays in each nostril twice a day as needed for a runny nose Continue Xhance 2 sprays in each nostril twice a day Begin  cetirizine 10 mg once a day as needed for a runny nose in about 1 week. Remember to rotate to a different antihistamine about every 3 months. Some examples of over the counter antihistamines include Zyrtec (cetirizine), Xyzal (levocetirizine), Allegra (fexofenadine), and Claritin (loratidine).  Continue allergen immunotherapy once every 2 weeks Continue allergen avoidance measures as listed below For thick post nasal drainage, begin Mucinex [phone] mg twice a day to thin mucus  Allergic conjunctivitis Continue antihistamine eye drops as needed for red, itchy eyes  Call the clinic if this treatment plan is not working well for you  Follow up in 2 months or sooner if needed.   Return in about 2 months (around 11/23/2019), or if symptoms worsen or fail to improve.    Thank you for the opportunity to care for this patient.  Please do not hesitate to contact me with questions.  Gareth Morgan, FNP [facility] [place]  ________________________________________________  I have provided oversight concerning [facility]'s evaluation and treatment of this patient's health issues addressed during today's encounter.  I agree with the assessment and therapeutic plan as outlined in the note.   Signed,   R Edgar Frisk, MD

## 2019-09-25 ENCOUNTER — Ambulatory Visit (INDEPENDENT_AMBULATORY_CARE_PROVIDER_SITE_OTHER): Payer: No Typology Code available for payment source | Admitting: Family Medicine

## 2019-09-25 ENCOUNTER — Encounter: Payer: Self-pay | Admitting: Family Medicine

## 2019-09-25 ENCOUNTER — Other Ambulatory Visit: Payer: Self-pay

## 2019-09-25 ENCOUNTER — Ambulatory Visit: Payer: Self-pay

## 2019-09-25 VITALS — BP 122/78 | HR 98 | Temp 96.3°F | Resp 17 | Ht 66.5 in | Wt 145.0 lb

## 2019-09-25 DIAGNOSIS — J309 Allergic rhinitis, unspecified: Secondary | ICD-10-CM

## 2019-09-25 DIAGNOSIS — J4541 Moderate persistent asthma with (acute) exacerbation: Secondary | ICD-10-CM | POA: Diagnosis not present

## 2019-09-25 DIAGNOSIS — H1013 Acute atopic conjunctivitis, bilateral: Secondary | ICD-10-CM

## 2019-09-25 DIAGNOSIS — J302 Other seasonal allergic rhinitis: Secondary | ICD-10-CM

## 2019-09-25 DIAGNOSIS — J3089 Other allergic rhinitis: Secondary | ICD-10-CM | POA: Diagnosis not present

## 2019-09-25 DIAGNOSIS — J45909 Unspecified asthma, uncomplicated: Secondary | ICD-10-CM | POA: Insufficient documentation

## 2019-09-25 MED ORDER — FLOVENT HFA 110 MCG/ACT IN AERO: INHALATION_SPRAY | RESPIRATORY_TRACT | 5 refills | Status: DC

## 2019-09-25 MED ORDER — IPRATROPIUM BROMIDE 0.06 % NA SOLN: NASAL | 5 refills | Status: DC

## 2019-09-25 NOTE — Patient Instructions (Addendum)
Asthma Begin Flovent 110-2 puffs twice a day with a spacer to prevent cough or wheeze Continue montelukast 10 mg once a day to prevent cough or wheeze Continue albuterol 2 puffs every 4 hours as needed for cough or wheeze  Allergic rhinitis Begin Atrovent nasal spray 2 sprays in each nostril twice a day as needed for a runny nose Continue Xhance 2 sprays in each nostril twice a day Begin cetirizine 10 mg once a day as needed for a runny nose in about 1 week. Remember to rotate to a different antihistamine about every 3 months. Some examples of over the counter antihistamines include Zyrtec (cetirizine), Xyzal (levocetirizine), Allegra (fexofenadine), and Claritin (loratidine).  Continue allergen immunotherapy once every 2 weeks Continue allergen avoidance measures as listed below For thick post nasal drainage, begin Mucinex [phone] mg twice a day to thin mucus  Allergic conjunctivitis Continue antihistamine eye drops as needed for red, itchy eyes  Call the clinic if this treatment plan is not working well for you  Follow up in 2 months or sooner if needed.  Reducing Pollen Exposure The American Academy of Allergy, Asthma and Immunology suggests the following steps to reduce your exposure to pollen during allergy seasons. 1. Do not hang sheets or clothing out to dry; pollen may collect on these items. 2. Do not mow lawns or spend time around freshly cut grass; mowing stirs up pollen. 3. Keep windows closed at night.  Keep car windows closed while driving. 4. Minimize morning activities outdoors, a time when pollen counts are usually at their highest. 5. Stay indoors as much as possible when pollen counts or humidity is high and on windy days when pollen tends to remain in the air longer. 6. Use air conditioning when possible.  Many air conditioners have filters that trap the pollen spores. 7. Use a HEPA room air filter to remove pollen form the indoor air you breathe.   Control of Dust  Mite Allergen Dust mites play a major role in allergic asthma and rhinitis. They occur in environments with high humidity wherever human skin is found. Dust mites absorb humidity from the atmosphere (ie, they do not drink) and feed on organic matter (including shed human and animal skin). Dust mites are a microscopic type of insect that you cannot see with the naked eye. High levels of dust mites have been detected from mattresses, pillows, carpets, upholstered furniture, bed covers, clothes, soft toys and any woven material. The principal allergen of the dust mite is found in its feces. A gram of dust may contain 1,000 mites and 250,000 fecal particles. Mite antigen is easily measured in the air during house cleaning activities. Dust mites do not bite and do not cause harm to humans, other than by triggering allergies/asthma.  Ways to decrease your exposure to dust mites in your home:  1. Encase mattresses, box springs and pillows with a mite-impermeable barrier or cover  2. Wash sheets, blankets and drapes weekly in hot water (130 F) with detergent and dry them in a dryer on the hot setting.  3. Have the room cleaned frequently with a vacuum cleaner and a damp dust-mop. For carpeting or rugs, vacuuming with a vacuum cleaner equipped with a high-efficiency particulate air (HEPA) filter. The dust mite allergic individual should not be in a room which is being cleaned and should wait 1 hour after cleaning before going into the room.  4. Do not sleep on upholstered furniture (eg, couches).  5. If possible removing  carpeting, upholstered furniture and drapery from the home is ideal. Horizontal blinds should be eliminated in the rooms where the person spends the most time (bedroom, study, television room). Washable vinyl, roller-type shades are optimal.  6. Remove all non-washable stuffed toys from the bedroom. Wash stuffed toys weekly like sheets and blankets above.  7. Reduce indoor humidity to less  than 50%. Inexpensive humidity monitors can be purchased at most hardware stores. Do not use a humidifier as can make the problem worse and are not recommended.

## 2019-09-26 ENCOUNTER — Telehealth: Payer: Self-pay

## 2019-09-26 NOTE — Telephone Encounter (Signed)
Patient called to let us know that Flovent is costing her 228.00. Was wondering if something cheaper could be called out. I tried to look on [employer] drug formulary but couldn't find anything. Spoke with patient to see if she could find out what was covered and to let us know.

## 2019-09-26 NOTE — Telephone Encounter (Signed)
Can you please find out if any other inhaled corticosteroids are covered? Like asthmanex, pulmicort, qvar? Or generic formulation of there?  Thank you

## 2019-09-29 NOTE — Telephone Encounter (Signed)
Spoke with patient, states that she talked to someone from her insurance company and Flovent was the cheapest. She did not fill prescription. Looked online for a coupon but it only took a small amount off. Is feeling a little better today.

## 2019-10-06 NOTE — Telephone Encounter (Signed)
Please let this patient know that I have found a sample of Pulmicort that she can try which would be similar to the Flovent. She can try the sample to see if her symptoms improve and then we can decide if we should move forward with finding an affordable inhaler. The Pulmicort sample will be at the front desk at the [facility] office if she is interested. Thank you

## 2019-10-06 NOTE — Telephone Encounter (Signed)
Thank you :)

## 2019-10-06 NOTE — Telephone Encounter (Signed)
Informed pt about the sample she is due for allergy injection this week and will pick it up then

## 2019-10-07 ENCOUNTER — Telehealth: Payer: Self-pay

## 2019-10-07 ENCOUNTER — Ambulatory Visit (INDEPENDENT_AMBULATORY_CARE_PROVIDER_SITE_OTHER): Payer: No Typology Code available for payment source

## 2019-10-07 DIAGNOSIS — J309 Allergic rhinitis, unspecified: Secondary | ICD-10-CM

## 2019-10-07 NOTE — Telephone Encounter (Signed)
Ambs, Norvel Richards, FNP  [facility]  Can you please call [facility] and get records for [employer] (also goes by Audree Camel). Looking for any allergy testing or what was included in her allergy immunotherapy vials. She was a patient there about 2015 through 2017 or 2018. In that area anyway. Thank you    Lm for pt to sign medical release when she comes in for her allergy injection, so we can get the medical records from ent

## 2019-10-22 ENCOUNTER — Ambulatory Visit (INDEPENDENT_AMBULATORY_CARE_PROVIDER_SITE_OTHER): Payer: No Typology Code available for payment source

## 2019-10-22 DIAGNOSIS — J309 Allergic rhinitis, unspecified: Secondary | ICD-10-CM | POA: Diagnosis not present

## 2019-11-07 ENCOUNTER — Telehealth: Payer: Self-pay

## 2019-11-07 NOTE — Telephone Encounter (Signed)
Left message for patient to call clinic to give update on allergic rhinitis.

## 2019-11-07 NOTE — Telephone Encounter (Signed)
-----   Message from [name], FNP sent at 11/07/2019  1:20 PM EST ----- Can you please check up on this patient's allergic rhinitis? Is she having any relief with the new interventions. Thank you

## 2019-11-11 ENCOUNTER — Ambulatory Visit (INDEPENDENT_AMBULATORY_CARE_PROVIDER_SITE_OTHER): Payer: No Typology Code available for payment source

## 2019-11-11 DIAGNOSIS — J309 Allergic rhinitis, unspecified: Secondary | ICD-10-CM

## 2019-11-11 NOTE — Telephone Encounter (Signed)
Tried to call patient at work number.  Number is invalid.  Message was left earlier today on home number listed.

## 2019-11-11 NOTE — Telephone Encounter (Signed)
Left message for pt. to return call to the office to give [place] an update on her allergic rhinitis.

## 2019-11-14 NOTE — Telephone Encounter (Signed)
Called pt she stated she has been doing a lot better.

## 2019-11-14 NOTE — Telephone Encounter (Signed)
Thank you :)

## 2019-11-21 ENCOUNTER — Ambulatory Visit (INDEPENDENT_AMBULATORY_CARE_PROVIDER_SITE_OTHER): Payer: No Typology Code available for payment source

## 2019-11-21 DIAGNOSIS — J309 Allergic rhinitis, unspecified: Secondary | ICD-10-CM

## 2019-11-26 ENCOUNTER — Ambulatory Visit (INDEPENDENT_AMBULATORY_CARE_PROVIDER_SITE_OTHER): Payer: No Typology Code available for payment source

## 2019-11-26 DIAGNOSIS — J309 Allergic rhinitis, unspecified: Secondary | ICD-10-CM

## 2019-12-04 ENCOUNTER — Ambulatory Visit (INDEPENDENT_AMBULATORY_CARE_PROVIDER_SITE_OTHER): Payer: No Typology Code available for payment source

## 2019-12-04 DIAGNOSIS — J309 Allergic rhinitis, unspecified: Secondary | ICD-10-CM

## 2019-12-10 ENCOUNTER — Ambulatory Visit (INDEPENDENT_AMBULATORY_CARE_PROVIDER_SITE_OTHER): Payer: No Typology Code available for payment source

## 2019-12-10 DIAGNOSIS — J309 Allergic rhinitis, unspecified: Secondary | ICD-10-CM

## 2019-12-23 ENCOUNTER — Ambulatory Visit (INDEPENDENT_AMBULATORY_CARE_PROVIDER_SITE_OTHER): Payer: No Typology Code available for payment source | Admitting: *Deleted

## 2019-12-23 DIAGNOSIS — J309 Allergic rhinitis, unspecified: Secondary | ICD-10-CM | POA: Diagnosis not present

## 2019-12-30 DIAGNOSIS — J3089 Other allergic rhinitis: Secondary | ICD-10-CM

## 2019-12-30 NOTE — Progress Notes (Signed)
Vial exp 12-29-20 

## 2020-01-07 ENCOUNTER — Ambulatory Visit (INDEPENDENT_AMBULATORY_CARE_PROVIDER_SITE_OTHER): Payer: No Typology Code available for payment source

## 2020-01-07 DIAGNOSIS — J309 Allergic rhinitis, unspecified: Secondary | ICD-10-CM | POA: Diagnosis not present

## 2020-01-22 ENCOUNTER — Ambulatory Visit (INDEPENDENT_AMBULATORY_CARE_PROVIDER_SITE_OTHER): Payer: No Typology Code available for payment source

## 2020-01-22 DIAGNOSIS — J309 Allergic rhinitis, unspecified: Secondary | ICD-10-CM

## 2020-02-05 ENCOUNTER — Ambulatory Visit (INDEPENDENT_AMBULATORY_CARE_PROVIDER_SITE_OTHER): Payer: No Typology Code available for payment source

## 2020-02-05 DIAGNOSIS — J309 Allergic rhinitis, unspecified: Secondary | ICD-10-CM

## 2020-02-20 ENCOUNTER — Ambulatory Visit (INDEPENDENT_AMBULATORY_CARE_PROVIDER_SITE_OTHER): Payer: No Typology Code available for payment source

## 2020-02-20 DIAGNOSIS — J309 Allergic rhinitis, unspecified: Secondary | ICD-10-CM | POA: Diagnosis not present

## 2020-03-10 ENCOUNTER — Ambulatory Visit (INDEPENDENT_AMBULATORY_CARE_PROVIDER_SITE_OTHER): Payer: No Typology Code available for payment source

## 2020-03-10 DIAGNOSIS — J309 Allergic rhinitis, unspecified: Secondary | ICD-10-CM | POA: Diagnosis not present

## 2020-03-19 ENCOUNTER — Ambulatory Visit (INDEPENDENT_AMBULATORY_CARE_PROVIDER_SITE_OTHER): Payer: No Typology Code available for payment source

## 2020-03-19 DIAGNOSIS — J309 Allergic rhinitis, unspecified: Secondary | ICD-10-CM | POA: Diagnosis not present

## 2020-03-26 ENCOUNTER — Ambulatory Visit (INDEPENDENT_AMBULATORY_CARE_PROVIDER_SITE_OTHER): Payer: No Typology Code available for payment source

## 2020-03-26 DIAGNOSIS — J309 Allergic rhinitis, unspecified: Secondary | ICD-10-CM | POA: Diagnosis not present

## 2020-04-02 ENCOUNTER — Ambulatory Visit (INDEPENDENT_AMBULATORY_CARE_PROVIDER_SITE_OTHER): Payer: No Typology Code available for payment source

## 2020-04-02 DIAGNOSIS — J309 Allergic rhinitis, unspecified: Secondary | ICD-10-CM

## 2020-04-12 ENCOUNTER — Ambulatory Visit (INDEPENDENT_AMBULATORY_CARE_PROVIDER_SITE_OTHER): Payer: No Typology Code available for payment source

## 2020-04-12 DIAGNOSIS — J309 Allergic rhinitis, unspecified: Secondary | ICD-10-CM | POA: Diagnosis not present

## 2020-04-26 ENCOUNTER — Ambulatory Visit (INDEPENDENT_AMBULATORY_CARE_PROVIDER_SITE_OTHER): Payer: No Typology Code available for payment source

## 2020-04-26 DIAGNOSIS — J309 Allergic rhinitis, unspecified: Secondary | ICD-10-CM | POA: Diagnosis not present

## 2020-05-12 ENCOUNTER — Ambulatory Visit (INDEPENDENT_AMBULATORY_CARE_PROVIDER_SITE_OTHER): Payer: No Typology Code available for payment source

## 2020-05-12 DIAGNOSIS — J309 Allergic rhinitis, unspecified: Secondary | ICD-10-CM

## 2020-05-17 ENCOUNTER — Other Ambulatory Visit: Payer: Self-pay

## 2020-05-17 ENCOUNTER — Telehealth: Payer: Self-pay | Admitting: Family Medicine

## 2020-05-17 MED ORDER — XHANCE 93 MCG/ACT NA EXHU: INHALANT_SUSPENSION | NASAL | 1 refills | Status: DC

## 2020-05-17 NOTE — Telephone Encounter (Signed)
Pt request Xhance refill she has an appt. 9/22

## 2020-05-17 NOTE — Telephone Encounter (Signed)
[facility]

## 2020-05-18 ENCOUNTER — Other Ambulatory Visit: Payer: Self-pay | Admitting: *Deleted

## 2020-05-18 MED ORDER — XHANCE 93 MCG/ACT NA EXHU: INHALANT_SUSPENSION | NASAL | 0 refills | Status: DC

## 2020-05-26 ENCOUNTER — Other Ambulatory Visit: Payer: Self-pay

## 2020-05-26 ENCOUNTER — Encounter: Payer: Self-pay | Admitting: Family Medicine

## 2020-05-26 ENCOUNTER — Ambulatory Visit (INDEPENDENT_AMBULATORY_CARE_PROVIDER_SITE_OTHER): Payer: No Typology Code available for payment source | Admitting: Family Medicine

## 2020-05-26 VITALS — BP 102/70 | HR 84 | Temp 98.1°F | Resp 18 | Ht 66.5 in | Wt 139.2 lb

## 2020-05-26 DIAGNOSIS — J302 Other seasonal allergic rhinitis: Secondary | ICD-10-CM | POA: Diagnosis not present

## 2020-05-26 DIAGNOSIS — J3089 Other allergic rhinitis: Secondary | ICD-10-CM

## 2020-05-26 DIAGNOSIS — J4520 Mild intermittent asthma, uncomplicated: Secondary | ICD-10-CM

## 2020-05-26 DIAGNOSIS — H1013 Acute atopic conjunctivitis, bilateral: Secondary | ICD-10-CM

## 2020-05-26 MED ORDER — EPINEPHRINE 0.3 MG/0.3ML IJ SOAJ
INTRAMUSCULAR | 5 refills | Status: DC
Start: 2020-05-26 — End: 2021-10-26

## 2020-05-26 NOTE — Progress Notes (Addendum)
[address] [place] [address] Dept: [phone]  FOLLOW UP NOTE  Patient ID: Kelsey Benjamin, female    DOB: 08/23/1971  Age: 49 y.o. MRN: [id number] Date of Office Visit: 05/26/2020  Assessment  Chief Complaint: Asthma and Allergies  HPI Kelsey Benjamin is a 49 year old female who presents in the clinic for follow-up visit.  She was last seen in this clinic on 09/25/2019 by and Zalaya Astarita, FNP, for evaluation of asthma, allergic rhinitis on allergen immunotherapy, and allergic conjunctivitis.  At today's visit, she reports her asthma has been well controlled with no shortness of breath, cough, or wheeze with activity or rest.  She does report feeling as though she is struggling to breathe when she is wearing a face covering such as a mask.  She continues to use albuterol on a regular basis before putting on the mask.  She reports that she does not need to wear mask when she is in her office by herself.  She is not taking montelukast as this made her feel somewhat moody and she did not want to take this medication if it was not needed.  She has not needed to begin Flovent 110 for asthma flare.  Allergic rhinitis is reported as moderately well controlled with the main symptom of clear rhinorrhea occurring intermittently.  She continues cetirizine 10 mg once a day and XHANCE 2 sprays in each nostril twice a day.  She has not needed Atrovent lately, however, feels as though this controls rhinorrhea well.  She continues allergy immunotherapy with no large local reactions.  She reports a significant decrease in her symptoms of allergic rhinitis while continuing allergen immunotherapy.  Allergic conjunctivitis is reported as well controlled with no medical intervention at this time.  Her current medications are listed in the chart.   Drug Allergies:  Allergies  Allergen Reactions  . Doxycycline Rash    Physical Exam: BP 102/70   Pulse 84   Temp 98.1 F (36.7 C) (Oral)   Resp 18   Ht  5' 6.5" (1.689 m)   Wt 139 lb 3.2 oz (63.1 kg)   SpO2 97%   BMI 22.13 kg/m    Physical Exam Vitals reviewed.  Constitutional:      Appearance: Normal appearance.  HENT:     Head: Normocephalic and atraumatic.     Right Ear: Tympanic membrane normal.     Left Ear: Tympanic membrane normal.     Nose:     Comments: Bilateral nares slightly erythematous with no nasal drainage noted.  Pharynx normal.  Ears normal.  Eyes normal.    Mouth/Throat:     Pharynx: Oropharynx is clear.  Eyes:     Conjunctiva/sclera: Conjunctivae normal.  Cardiovascular:     Rate and Rhythm: Normal rate and regular rhythm.     Heart sounds: Normal heart sounds. No murmur heard.   Pulmonary:     Effort: Pulmonary effort is normal.     Breath sounds: Normal breath sounds.     Comments: Lungs clear to auscultation Musculoskeletal:        General: Normal range of motion.     Cervical back: Normal range of motion and neck supple.  Skin:    General: Skin is warm and dry.  Neurological:     Mental Status: She is alert and oriented to person, place, and time.  Psychiatric:        Mood and Affect: Mood normal.        Behavior: Behavior normal.  Thought Content: Thought content normal.        Judgment: Judgment normal.     Diagnostics: FVC 4.60, FEV1 3.52.  Predicted FVC 3.96, predicted FEV1 3.16.  Spirometry indicates normal ventilatory function.  Assessment and Plan: 1. 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 Seasonal and perennial allergic rhinitis   3. Allergic conjunctivitis of both eyes     Meds ordered this encounter  Medications  . EPINEPHrine (AUVI-Q) 0.3 mg/0.3 mL IJ SOAJ injection    Sig: Use as directed for severe allergic reactions.    Dispense:  1 each    Refill:  5    Patient Instructions  Asthma Continue albuterol 2 puffs every 4 hours as needed for cough or wheeze For asthma flare, begin Flovent 110-2 puffs twice a day with a spacer for 2 weeks or  until cough and wheeze free  Allergic rhinitis Continue Atrovent nasal spray 2 sprays in each nostril twice a day as needed for a runny nose Continue Xhance 2 sprays in each nostril twice a day Continue cetirizine 10 mg once a day as needed for a runny nose in about 1 week. Remember to rotate to a different antihistamine about every 3 months. Some examples of over the counter antihistamines include Zyrtec (cetirizine), Xyzal (levocetirizine), Allegra (fexofenadine), and Claritin (loratidine).  Continue allergen immunotherapy and have access to an epinephrine auto-injector set Continue allergen avoidance measures as listed below  Allergic conjunctivitis Continue antihistamine eye drops as needed for red, itchy eyes. Some over the counter eye drops include Pataday one drop in each eye once a day as needed for red, itchy eyes OR Zaditor one drop in each eye twice a day as needed for red itchy eyes.  Call the clinic if this treatment plan is not working well for you  Follow up in 6 months or sooner if needed.   Return in about 6 months (around 11/23/2020), or if symptoms worsen or fail to improve.    Thank you for the opportunity to care for this patient.  Please do not hesitate to contact me with questions.  Thermon Leyland, FNP [facility]  ________________________________________________  I have provided oversight concerning [facility]'s evaluation and treatment of this patient's health issues addressed during today's encounter.  I agree with the assessment and therapeutic plan as outlined in the note.   Signed,   R Jorene Guest, MD

## 2020-05-26 NOTE — Patient Instructions (Addendum)
Asthma Continue albuterol 2 puffs every 4 hours as needed for cough or wheeze For asthma flare, begin Flovent 110-2 puffs twice a day with a spacer for 2 weeks or until cough and wheeze free  Allergic rhinitis Continue Atrovent nasal spray 2 sprays in each nostril twice a day as needed for a runny nose Continue Xhance 2 sprays in each nostril twice a day Continue cetirizine 10 mg once a day as needed for a runny nose in about 1 week. Remember to rotate to a different antihistamine about every 3 months. Some examples of over the counter antihistamines include Zyrtec (cetirizine), Xyzal (levocetirizine), Allegra (fexofenadine), and Claritin (loratidine).  Continue allergen immunotherapy and have access to an epinephrine auto-injector set Continue allergen avoidance measures as listed below  Allergic conjunctivitis Continue antihistamine eye drops as needed for red, itchy eyes. Some over the counter eye drops include Pataday one drop in each eye once a day as needed for red, itchy eyes OR Zaditor one drop in each eye twice a day as needed for red itchy eyes.  Call the clinic if this treatment plan is not working well for you  Follow up in 6 months or sooner if needed.  Reducing Pollen Exposure The American Academy of Allergy, Asthma and Immunology suggests the following steps to reduce your exposure to pollen during allergy seasons. 1. Do not hang sheets or clothing out to dry; pollen may collect on these items. 2. Do not mow lawns or spend time around freshly cut grass; mowing stirs up pollen. 3. Keep windows closed at night.  Keep car windows closed while driving. 4. Minimize morning activities outdoors, a time when pollen counts are usually at their highest. 5. Stay indoors as much as possible when pollen counts or humidity is high and on windy days when pollen tends to remain in the air longer. 6. Use air conditioning when possible.  Many air conditioners have filters that trap the pollen  spores. 7. Use a HEPA room air filter to remove pollen form the indoor air you breathe.   Control of Dust Mite Allergen Dust mites play a major role in allergic asthma and rhinitis. They occur in environments with high humidity wherever human skin is found. Dust mites absorb humidity from the atmosphere (ie, they do not drink) and feed on organic matter (including shed human and animal skin). Dust mites are a microscopic type of insect that you cannot see with the naked eye. High levels of dust mites have been detected from mattresses, pillows, carpets, upholstered furniture, bed covers, clothes, soft toys and any woven material. The principal allergen of the dust mite is found in its feces. A gram of dust may contain 1,000 mites and 250,000 fecal particles. Mite antigen is easily measured in the air during house cleaning activities. Dust mites do not bite and do not cause harm to humans, other than by triggering allergies/asthma.  Ways to decrease your exposure to dust mites in your home:  1. Encase mattresses, box springs and pillows with a mite-impermeable barrier or cover  2. Wash sheets, blankets and drapes weekly in hot water (130 F) with detergent and dry them in a dryer on the hot setting.  3. Have the room cleaned frequently with a vacuum cleaner and a damp dust-mop. For carpeting or rugs, vacuuming with a vacuum cleaner equipped with a high-efficiency particulate air (HEPA) filter. The dust mite allergic individual should not be in a room which is being cleaned and should wait 1 hour  after cleaning before going into the room.  4. Do not sleep on upholstered furniture (eg, couches).  5. If possible removing carpeting, upholstered furniture and drapery from the home is ideal. Horizontal blinds should be eliminated in the rooms where the person spends the most time (bedroom, study, television room). Washable vinyl, roller-type shades are optimal.  6. Remove all non-washable stuffed toys  from the bedroom. Wash stuffed toys weekly like sheets and blankets above.  7. Reduce indoor humidity to less than 50%. Inexpensive humidity monitors can be purchased at most hardware stores. Do not use a humidifier as can make the problem worse and are not recommended.

## 2020-05-27 DIAGNOSIS — J3089 Other allergic rhinitis: Secondary | ICD-10-CM

## 2020-05-27 NOTE — Progress Notes (Signed)
VIAL EXP 05-27-21 

## 2020-06-05 ENCOUNTER — Encounter (HOSPITAL_BASED_OUTPATIENT_CLINIC_OR_DEPARTMENT_OTHER): Payer: Self-pay | Admitting: Emergency Medicine

## 2020-06-05 ENCOUNTER — Emergency Department (HOSPITAL_BASED_OUTPATIENT_CLINIC_OR_DEPARTMENT_OTHER)
Admission: EM | Admit: 2020-06-05 | Discharge: 2020-06-05 | Disposition: A | Discharge status: Home / Self Care | Payer: No Typology Code available for payment source | Attending: Emergency Medicine | Admitting: Emergency Medicine

## 2020-06-05 ENCOUNTER — Other Ambulatory Visit: Payer: Self-pay

## 2020-06-05 ENCOUNTER — Emergency Department (HOSPITAL_BASED_OUTPATIENT_CLINIC_OR_DEPARTMENT_OTHER): Payer: No Typology Code available for payment source

## 2020-06-05 DIAGNOSIS — Z7951 Long term (current) use of inhaled steroids: Secondary | ICD-10-CM | POA: Diagnosis not present

## 2020-06-05 DIAGNOSIS — Z79899 Other long term (current) drug therapy: Secondary | ICD-10-CM | POA: Diagnosis not present

## 2020-06-05 DIAGNOSIS — U071 COVID-19: Secondary | ICD-10-CM | POA: Diagnosis not present

## 2020-06-05 DIAGNOSIS — J45909 Unspecified asthma, uncomplicated: Secondary | ICD-10-CM | POA: Diagnosis not present

## 2020-06-05 DIAGNOSIS — R5383 Other fatigue: Secondary | ICD-10-CM

## 2020-06-05 DIAGNOSIS — R059 Cough, unspecified: Secondary | ICD-10-CM | POA: Diagnosis present

## 2020-06-05 HISTORY — DX: Unspecified asthma, uncomplicated: J45.909

## 2020-06-05 LAB — CBC
HCT: 42.6 % (ref 36.0–46.0)
Hemoglobin: 14 g/dL (ref 12.0–15.0)
MCH: 31.1 pg (ref 26.0–34.0)
MCHC: 32.9 g/dL (ref 30.0–36.0)
MCV: 94.7 fL (ref 80.0–100.0)
Platelets: 176 10*3/uL (ref 150–400)
RBC: 4.5 MIL/uL (ref 3.87–5.11)
RDW: 12.2 % (ref 11.5–15.5)
WBC: 5.2 10*3/uL (ref 4.0–10.5)
nRBC: 0 % (ref 0.0–0.2)

## 2020-06-05 LAB — PREGNANCY, URINE: Preg Test, Ur: NEGATIVE

## 2020-06-05 LAB — URINALYSIS, ROUTINE W REFLEX MICROSCOPIC
Bilirubin Urine: NEGATIVE
Glucose, UA: NEGATIVE mg/dL
Hgb urine dipstick: NEGATIVE
Ketones, ur: NEGATIVE mg/dL
Leukocytes,Ua: NEGATIVE
Nitrite: NEGATIVE
Protein, ur: NEGATIVE mg/dL
Specific Gravity, Urine: 1.01 (ref 1.005–1.030)
pH: 7.5 (ref 5.0–8.0)

## 2020-06-05 LAB — BASIC METABOLIC PANEL
Anion gap: 10 (ref 5–15)
BUN: 8 mg/dL (ref 6–20)
CO2: 27 mmol/L (ref 22–32)
Calcium: 8.7 mg/dL — ABNORMAL LOW (ref 8.9–10.3)
Chloride: 102 mmol/L (ref 98–111)
Creatinine, Ser: 0.71 mg/dL (ref 0.44–1.00)
GFR calc Af Amer: >60 mL/min (ref >60)
GFR calc non Af Amer: >60 mL/min (ref >60)
Glucose, Bld: 103 mg/dL — ABNORMAL HIGH (ref 70–99)
Potassium: 3.8 mmol/L (ref 3.5–5.1)
Sodium: 139 mmol/L (ref 135–145)

## 2020-06-05 MED ORDER — SODIUM CHLORIDE 0.9 % IV SOLN: 1200.0000 mg | Freq: Once | INTRAVENOUS | Status: DC

## 2020-06-05 MED ORDER — SODIUM CHLORIDE 0.9 % IV SOLN: Freq: Once | INTRAVENOUS | Status: DC

## 2020-06-05 MED ORDER — ALBUTEROL SULFATE HFA 108 (90 BASE) MCG/ACT IN AERS: 1.0000 | INHALATION_SPRAY | Freq: Four times a day (QID) | RESPIRATORY_TRACT | 0 refills | Status: DC | PRN

## 2020-06-05 MED ORDER — SODIUM CHLORIDE 0.9 % IV SOLN: INTRAVENOUS | Status: DC | PRN

## 2020-06-05 MED ORDER — ONDANSETRON HCL 4 MG/2ML IJ SOLN
4.0000 mg | Freq: Once | INTRAMUSCULAR | Status: AC
  Administered 2020-06-05: 4 mg via INTRAVENOUS
  Filled 2020-06-05: qty 2

## 2020-06-05 MED ORDER — EPINEPHRINE 0.3 MG/0.3ML IJ SOAJ: 0.3000 mg | Freq: Once | INTRAMUSCULAR | Status: DC | PRN

## 2020-06-05 MED ORDER — DIPHENHYDRAMINE HCL 50 MG/ML IJ SOLN: 50.0000 mg | Freq: Once | INTRAMUSCULAR | Status: DC | PRN

## 2020-06-05 MED ORDER — ONDANSETRON 4 MG PO TBDP: 4.0000 mg | ORAL_TABLET | Freq: Three times a day (TID) | ORAL | 0 refills | Status: DC | PRN

## 2020-06-05 MED ORDER — METHYLPREDNISOLONE SODIUM SUCC 125 MG IJ SOLR: 125.0000 mg | Freq: Once | INTRAMUSCULAR | Status: DC | PRN

## 2020-06-05 MED ORDER — SODIUM CHLORIDE 0.9 % IV BOLUS
1000.0000 mL | Freq: Once | INTRAVENOUS | Status: AC
  Administered 2020-06-05: 1000 mL via INTRAVENOUS

## 2020-06-05 MED ORDER — BENZONATATE 100 MG PO CAPS: 100.0000 mg | ORAL_CAPSULE | Freq: Three times a day (TID) | ORAL | 0 refills | Status: DC

## 2020-06-05 MED ORDER — DEXAMETHASONE 6 MG PO TABS: 6.0000 mg | ORAL_TABLET | Freq: Every day | ORAL | 0 refills | Status: DC

## 2020-06-05 MED ORDER — FAMOTIDINE IN NACL 20-0.9 MG/50ML-% IV SOLN: 20.0000 mg | Freq: Once | INTRAVENOUS | Status: DC | PRN

## 2020-06-05 MED ORDER — ALBUTEROL SULFATE HFA 108 (90 BASE) MCG/ACT IN AERS: 2.0000 | INHALATION_SPRAY | Freq: Once | RESPIRATORY_TRACT | Status: DC | PRN

## 2020-06-05 MED ORDER — DEXAMETHASONE SODIUM PHOSPHATE 10 MG/ML IJ SOLN
10.0000 mg | Freq: Once | INTRAMUSCULAR | Status: AC
  Administered 2020-06-05: 10 mg via INTRAVENOUS
  Filled 2020-06-05: qty 1

## 2020-06-05 MED ORDER — KETOROLAC TROMETHAMINE 30 MG/ML IJ SOLN
30.0000 mg | Freq: Once | INTRAMUSCULAR | Status: AC
  Administered 2020-06-05: 30 mg via INTRAVENOUS
  Filled 2020-06-05: qty 1

## 2020-06-05 NOTE — ED Notes (Signed)
ED Provider at bedside. 

## 2020-06-05 NOTE — ED Notes (Signed)
Pt states she has not decided if she wants to receive the mAb infusion here. Reports feeling better after med administration. Britni PA

## 2020-06-05 NOTE — ED Notes (Signed)
Pt given risk/benefit sheet for mAb infusion

## 2020-06-05 NOTE — Discharge Instructions (Signed)
Take the medications as prescribed  Return for new or worsening symptoms 

## 2020-06-05 NOTE — ED Notes (Signed)
Ambulated from lobby to room 11. SpO2 99-100% on r/a, HR 98-105, no distress noted.

## 2020-06-05 NOTE — ED Provider Notes (Signed)
[facility] EMERGENCY DEPARTMENT Provider Note   CSN: [id number] Arrival date & time: 06/05/20  1116    History Chief Complaint  Patient presents with  . COVID+    Kelsey Benjamin is a 49 y.o. female with past medical history significant for anxiety, asthma, allergic rhinitis who presents for evaluation of generalized feeling unwell.  Symptoms started 1 week ago.  Was diagnosed with Covid on Monday. See picture in chart.  Has had persistent fatigue, generalized weakness, nausea and cough.  Will occasionally feel tightness in her chest when she coughs however resolved when she is not coughing.  Had a fever yesterday has been taking Tylenol.  Denies headache, lightheadedness, dizziness, exertional chest pain, hemoptysis, shortness of breath, abdominal pain, diarrhea, dysuria, lower extremity edema, erythema or warmth.  She is not vaccinated against Covid.  History obtained from patient and past medical records.  No interpreter used.  HPI     Past Medical History:  Diagnosis Date  . Allergic rhinitis   . Anxiety   . Asthma     Patient Active Problem List   Diagnosis Date Noted  . Asthmatic bronchitis 09/25/2019  . Allergic conjunctivitis of both eyes 09/25/2019  . Seasonal and perennial allergic rhinitis 09/25/2019    Past Surgical History:  Procedure Laterality Date  . CHOLECYSTECTOMY  2005  . uterine ablation  2013     OB History   No obstetric history on file.     Family History  Problem Relation Age of Onset  . Allergic rhinitis Son   . Asthma Son   . Angioedema Neg Hx   . Eczema Neg Hx   . Immunodeficiency Neg Hx   . Urticaria Neg Hx     Social History   Tobacco Use  . Smoking status: Never Smoker  . Smokeless tobacco: Never Used  Vaping Use  . Vaping Use: Never used  Substance Use Topics  . Alcohol use: Yes  . Drug use: Never    Home Medications Prior to Admission medications   Medication Sig Start Date End Date Taking? Authorizing  Provider  albuterol (VENTOLIN HFA) 108 (90 Base) MCG/ACT inhaler Inhale 1-2 puffs into the lungs every 6 (six) hours as needed for wheezing or shortness of breath. 06/05/20   Kilan Banfill A, PA-C  Azelastine HCl 0.15 % SOLN Place 2 sprays into both nostrils 2 (two) times daily. 04/10/19   Marcelyn Bruins, MD  benzonatate (TESSALON) 100 MG capsule Take 1 capsule (100 mg total) by mouth every 8 (eight) hours. 06/05/20   Dannae Kato A, PA-C  citalopram (CELEXA) 40 MG tablet Take by mouth. Patient not taking: Reported on 05/26/2020 11/18/14   [provider]  dexamethasone (DECADRON) 6 MG tablet Take 1 tablet (6 mg total) by mouth daily for 4 days. 06/05/20 06/09/20  Elfrieda Espino A, PA-C  EPINEPHrine (AUVI-Q) 0.3 mg/0.3 mL IJ SOAJ injection Use as directed for severe allergic reactions. 05/26/20   [name], FNP  fluticasone (FLOVENT HFA) 110 MCG/ACT inhaler Two puffs with spacer twice a day. Patient not taking: Reported on 05/26/2020 09/25/19   [name], FNP  Fluticasone Propionate Timmothy Sours) 93 MCG/ACT EXHU Use 2 puffs in each nostril twice daily as directed 05/18/20   [name], FNP  ipratropium (ATROVENT) 0.06 % nasal spray Two sprays each nostril twice a day as needed. Patient not taking: Reported on 05/26/2020 09/25/19   [name], FNP  levocetirizine (XYZAL) 5 MG tablet Take 1 tablet (5  mg total) by mouth every evening. 05/14/19 05/08/20  Marcelyn Bruins, MD  montelukast (SINGULAIR) 10 MG tablet Take 1 tablet (10 mg total) by mouth at bedtime. 08/05/19   Marcelyn Bruins, MD  Olopatadine HCl (PAZEO) 0.7 % SOLN Place 1 drop into both eyes daily. Patient not taking: Reported on 04/10/2019 07/04/18   Marcelyn Bruins, MD  ondansetron (ZOFRAN ODT) 4 MG disintegrating tablet Take 1 tablet (4 mg total) by mouth every 8 (eight) hours as needed for nausea or vomiting. 06/05/20   Bentlie Catanzaro A, PA-C  sertraline (ZOLOFT) 50 MG tablet Take 50 mg by  mouth daily. Patient not taking: Reported on 05/26/2020    [provider]    Allergies    Doxycycline  Review of Systems   Review of Systems  Constitutional: Positive for activity change, appetite change, chills, fatigue and fever.  HENT: Negative.   Respiratory: Positive for cough and chest tightness. Negative for apnea, choking (With cough), shortness of breath, wheezing and stridor.   Cardiovascular: Negative.   Gastrointestinal: Positive for nausea. Negative for abdominal distention, abdominal pain, anal bleeding, blood in stool, constipation, diarrhea, rectal pain and vomiting.  Genitourinary: Negative.   Musculoskeletal: Negative.   Skin: Negative.   Neurological: Positive for weakness (Generalized). Negative for dizziness, tremors, seizures, syncope, facial asymmetry, speech difficulty, light-headedness, numbness and headaches.  All other systems reviewed and are negative.   Physical Exam Updated Vital Signs BP 116/78 (BP Location: Right Arm)   Pulse 81   Temp 98.5 F (36.9 C) (Oral)   Resp 16   Ht 5\' 8"  (1.727 m)   Wt 63 kg   SpO2 97%   BMI 21.13 kg/m   Physical Exam Vitals and nursing note reviewed.  Constitutional:      General: She is not in acute distress.    Appearance: She is well-developed. She is not ill-appearing, toxic-appearing or diaphoretic.  HENT:     Head: Normocephalic and atraumatic.     Jaw: There is normal jaw occlusion.     Right Ear: Tympanic membrane, ear canal and external ear normal. There is no impacted cerumen. No hemotympanum. Tympanic membrane is not injected, scarred, perforated, erythematous, retracted or bulging.     Left Ear: Tympanic membrane, ear canal and external ear normal. There is no impacted cerumen. No hemotympanum. Tympanic membrane is not injected, scarred, perforated, erythematous, retracted or bulging.     Ears:     Comments: No Mastoid tenderness.    Nose: Nose normal.     Comments: Clear rhinorrhea and  congestion to bilateral nares.  No sinus tenderness.    Mouth/Throat:     Comments: Posterior oropharynx clear.  Mucous membranes moist.  Tonsils without erythema or exudate.  Uvula midline without deviation.  No evidence of PTA or RPA.  No drooling, dysphasia or trismus.  Phonation normal. Eyes:     Pupils: Pupils are equal, round, and reactive to light.  Neck:     Trachea: Trachea and phonation normal.     Meningeal: Brudzinski's sign and Kernig's sign absent.     Comments: No Neck stiffness or neck rigidity.  No meningismus.  No cervical lymphadenopathy. Cardiovascular:     Rate and Rhythm: Normal rate.     Comments: No murmurs rubs or gallops. Pulmonary:     Effort: No respiratory distress.     Comments: Clear to auscultation bilaterally without wheeze, rhonchi or rales.  No accessory muscle usage.  Able speak in full sentences. Abdominal:  General: There is no distension.     Comments: Soft, nontender without rebound or guarding.  No CVA tenderness.  Musculoskeletal:        General: Normal range of motion.     Cervical back: Normal range of motion.     Comments: Moves all 4 extremities without difficulty.  Lower extremities without edema, erythema or warmth.  Skin:    General: Skin is warm and dry.     Comments: Brisk capillary refill.  No rashes or lesions.  Neurological:     Mental Status: She is alert.     Comments: Ambulatory in department without difficulty.  Cranial nerves II through XII grossly intact.  No facial droop.  No aphasia.     ED Results / Procedures / Treatments   Labs (all labs ordered are listed, but only abnormal results are displayed) Labs Reviewed  BASIC METABOLIC PANEL - Abnormal; Notable for the following components:      Result Value   Glucose, Bld 103 (*)    Calcium 8.7 (*)    All other components within normal limits  CBC  URINALYSIS, ROUTINE W REFLEX MICROSCOPIC  PREGNANCY, URINE    EKG None  Radiology DG Chest Portable 1  View  Result Date: 06/05/2020 CLINICAL DATA:  Dx w/ Covid 5 days ago; symptoms began 6 days ago; NO SOB; NO chest pain; No difficulty breathing; pt just feeling weak; fever yesterday EXAM: PORTABLE CHEST - 1 VIEW COMPARISON:  none FINDINGS: Lungs are clear. Heart size and mediastinal contours are within normal limits. No effusion. Visualized bones unremarkable. IMPRESSION: No acute cardiopulmonary disease. Electronically Signed   By: Corlis Leak  Hassell M.D.   On: 06/05/2020 13:42        Procedures Procedures (including critical care time)  Medications Ordered in ED Medications  0.9 %  sodium chloride infusion (has no administration in time range)  albuterol (VENTOLIN HFA) 108 (90 Base) MCG/ACT inhaler 2 puff (has no administration in time range)  casirivimab-imdevimab (REGEN-COV) 1,200 mg in sodium chloride 0.9 % 110 mL IVPB (has no administration in time range)  0.9 %  sodium chloride infusion (has no administration in time range)  diphenhydrAMINE (BENADRYL) injection 50 mg (has no administration in time range)  famotidine (PEPCID) IVPB 20 mg premix (has no administration in time range)  methylPREDNISolone sodium succinate (SOLU-MEDROL) 125 mg/2 mL injection 125 mg (has no administration in time range)  EPINEPHrine (EPI-PEN) injection 0.3 mg (has no administration in time range)  sodium chloride 0.9 % bolus 1,000 mL (0 mLs Intravenous Stopped 06/05/20 1507)  ondansetron (ZOFRAN) injection 4 mg (4 mg Intravenous Given 06/05/20 1334)  ketorolac (TORADOL) 30 MG/ML injection 30 mg (30 mg Intravenous Given 06/05/20 1339)  dexamethasone (DECADRON) injection 10 mg (10 mg Intravenous Given 06/05/20 1337)    ED Course  I have reviewed the triage vital signs and the nursing notes.  Pertinent labs & imaging results that were available during my care of the patient were reviewed by me and considered in my medical decision making (see chart for details).  49 year old female history of asthma presents for  evaluation of Covid symptoms.  Family tested positive on Monday however is not sure where he does not have the results.  Persistent generalized feeling unwell with fevers, malaise, myalgias and cough.  No shortness of breath.  No unilateral leg swelling, redness or warmth.  No musculoskeletal exam.  Clinically no evidence of DVT on exam.  Her heart and lungs are clear.  Labs and  imaging obtained from triage and personally reviewed and interpreted:  CBC without leukocytosis Metabolic panel with glucose 103, additional x-ray, renal normality Pregnancy test negative UA negative DG chest without acute infection, cardiomegaly, pneumothorax, pulmonary edema  Given patient's extensive history of persistent asthma would certainly benefit from MAB infusion.   Patient reassessed. Patient has not DECLINED MAB infusion. States will contact PCP if changes mind. Discussed medications as time sensitive.  Patient reassessed.  She was given information sheet about MAB infusion.  She declines infusion at this time.  Will DC home with symptomatic management.  She is tolerating p.o. intake and ambulatory without difficulty.  Close follow-up with PCP.  The patient has been appropriately medically screened and/or stabilized in the ED. I have low suspicion for any other emergent medical condition which would require further screening, evaluation or treatment in the ED or require inpatient management.  Patient is hemodynamically stable and in no acute distress.  Patient able to ambulate in department prior to ED.  Evaluation does not show acute pathology that would require ongoing or additional emergent interventions while in the emergency department or further inpatient treatment.  I have discussed the diagnosis with the patient and answered all questions.  Pain is been managed while in the emergency department and patient has no further complaints prior to discharge.  Patient is comfortable with plan discussed in room  and is stable for discharge at this time.  I have discussed strict return precautions for returning to the emergency department.  Patient was encouraged to follow-up with PCP/specialist refer to at discharge.      MDM Rules/Calculators/A&P                         Kelsey Benjamin was evaluated in Emergency Department on 06/05/2020 for the symptoms described in the history of present illness. She was evaluated in the context of the global COVID-19 pandemic, which necessitated consideration that the patient might be at risk for infection with the SARS-CoV-2 virus that causes COVID-19. Institutional protocols and algorithms that pertain to the evaluation of patients at risk for COVID-19 are in a state of rapid change based on information released by regulatory bodies including the CDC and federal and state organizations. These policies and algorithms were followed during the patient's care in the ED. Final Clinical Impression(s) / ED Diagnoses Final diagnoses:  COVID-19  Fatigue, unspecified type    Rx / DC Orders ED Discharge Orders         Ordered    benzonatate (TESSALON) 100 MG capsule  Every 8 hours        06/05/20 1349    ondansetron (ZOFRAN ODT) 4 MG disintegrating tablet  Every 8 hours PRN        06/05/20 1349    albuterol (VENTOLIN HFA) 108 (90 Base) MCG/ACT inhaler  Every 6 hours PRN        06/05/20 1349    dexamethasone (DECADRON) 6 MG tablet  Daily        06/05/20 1522           Madora Barletta A, PA-C 06/05/20 1524    Sabas Sous, MD 06/06/20 [phone]

## 2020-06-05 NOTE — ED Triage Notes (Signed)
Diagnosed with COVID on Monday. States she feels bad. Denies SOB

## 2020-06-06 ENCOUNTER — Telehealth (HOSPITAL_BASED_OUTPATIENT_CLINIC_OR_DEPARTMENT_OTHER): Payer: Self-pay | Admitting: Physician Assistant

## 2020-06-06 MED ORDER — BENZONATATE 100 MG PO CAPS: 100.0000 mg | ORAL_CAPSULE | Freq: Three times a day (TID) | ORAL | 0 refills | Status: DC

## 2020-06-06 MED ORDER — DEXAMETHASONE 6 MG PO TABS: 6.0000 mg | ORAL_TABLET | Freq: Every day | ORAL | 0 refills | Status: DC

## 2020-06-06 MED ORDER — ONDANSETRON 4 MG PO TBDP: 4.0000 mg | ORAL_TABLET | Freq: Three times a day (TID) | ORAL | 0 refills | Status: DC | PRN

## 2020-06-06 MED ORDER — DEXAMETHASONE 6 MG PO TABS: 6.0000 mg | ORAL_TABLET | Freq: Every day | ORAL | 0 refills | Status: AC

## 2020-06-06 NOTE — Telephone Encounter (Signed)
Medications did not cross over to pharmacy. Re sent Rx in

## 2020-07-05 ENCOUNTER — Ambulatory Visit (INDEPENDENT_AMBULATORY_CARE_PROVIDER_SITE_OTHER): Payer: No Typology Code available for payment source

## 2020-07-05 DIAGNOSIS — J309 Allergic rhinitis, unspecified: Secondary | ICD-10-CM

## 2020-07-06 ENCOUNTER — Other Ambulatory Visit: Payer: Self-pay

## 2020-07-06 MED ORDER — XHANCE 93 MCG/ACT NA EXHU: INHALANT_SUSPENSION | NASAL | 0 refills | Status: DC

## 2020-07-12 ENCOUNTER — Ambulatory Visit (INDEPENDENT_AMBULATORY_CARE_PROVIDER_SITE_OTHER): Payer: No Typology Code available for payment source

## 2020-07-12 DIAGNOSIS — J309 Allergic rhinitis, unspecified: Secondary | ICD-10-CM | POA: Diagnosis not present

## 2020-07-26 ENCOUNTER — Ambulatory Visit (INDEPENDENT_AMBULATORY_CARE_PROVIDER_SITE_OTHER): Payer: No Typology Code available for payment source

## 2020-07-26 DIAGNOSIS — J309 Allergic rhinitis, unspecified: Secondary | ICD-10-CM

## 2020-08-02 ENCOUNTER — Other Ambulatory Visit: Payer: Self-pay | Admitting: *Deleted

## 2020-08-02 MED ORDER — XHANCE 93 MCG/ACT NA EXHU: INHALANT_SUSPENSION | NASAL | 0 refills | Status: DC

## 2020-08-04 ENCOUNTER — Ambulatory Visit (INDEPENDENT_AMBULATORY_CARE_PROVIDER_SITE_OTHER): Payer: No Typology Code available for payment source

## 2020-08-04 DIAGNOSIS — J309 Allergic rhinitis, unspecified: Secondary | ICD-10-CM

## 2020-08-10 ENCOUNTER — Ambulatory Visit (INDEPENDENT_AMBULATORY_CARE_PROVIDER_SITE_OTHER): Payer: No Typology Code available for payment source

## 2020-08-10 DIAGNOSIS — J309 Allergic rhinitis, unspecified: Secondary | ICD-10-CM

## 2020-08-16 ENCOUNTER — Ambulatory Visit (INDEPENDENT_AMBULATORY_CARE_PROVIDER_SITE_OTHER): Payer: No Typology Code available for payment source

## 2020-08-16 DIAGNOSIS — J309 Allergic rhinitis, unspecified: Secondary | ICD-10-CM

## 2020-08-26 ENCOUNTER — Other Ambulatory Visit: Payer: Self-pay | Admitting: *Deleted

## 2020-08-26 MED ORDER — XHANCE 93 MCG/ACT NA EXHU: INHALANT_SUSPENSION | NASAL | 1 refills | Status: DC

## 2020-09-01 ENCOUNTER — Ambulatory Visit (INDEPENDENT_AMBULATORY_CARE_PROVIDER_SITE_OTHER): Payer: No Typology Code available for payment source

## 2020-09-01 DIAGNOSIS — J309 Allergic rhinitis, unspecified: Secondary | ICD-10-CM

## 2020-09-13 ENCOUNTER — Ambulatory Visit (INDEPENDENT_AMBULATORY_CARE_PROVIDER_SITE_OTHER): Payer: No Typology Code available for payment source

## 2020-09-13 DIAGNOSIS — J309 Allergic rhinitis, unspecified: Secondary | ICD-10-CM | POA: Diagnosis not present

## 2020-09-27 ENCOUNTER — Ambulatory Visit (INDEPENDENT_AMBULATORY_CARE_PROVIDER_SITE_OTHER): Payer: No Typology Code available for payment source

## 2020-09-27 DIAGNOSIS — J309 Allergic rhinitis, unspecified: Secondary | ICD-10-CM | POA: Diagnosis not present

## 2020-10-11 DIAGNOSIS — J3089 Other allergic rhinitis: Secondary | ICD-10-CM | POA: Diagnosis not present

## 2020-10-11 NOTE — Progress Notes (Signed)
Vial exp 10-11-21 

## 2020-11-01 ENCOUNTER — Ambulatory Visit (INDEPENDENT_AMBULATORY_CARE_PROVIDER_SITE_OTHER): Payer: No Typology Code available for payment source

## 2020-11-01 DIAGNOSIS — J309 Allergic rhinitis, unspecified: Secondary | ICD-10-CM

## 2020-11-08 ENCOUNTER — Ambulatory Visit (INDEPENDENT_AMBULATORY_CARE_PROVIDER_SITE_OTHER): Payer: No Typology Code available for payment source

## 2020-11-08 DIAGNOSIS — J309 Allergic rhinitis, unspecified: Secondary | ICD-10-CM

## 2020-11-22 ENCOUNTER — Ambulatory Visit (INDEPENDENT_AMBULATORY_CARE_PROVIDER_SITE_OTHER): Payer: No Typology Code available for payment source

## 2020-11-22 DIAGNOSIS — J309 Allergic rhinitis, unspecified: Secondary | ICD-10-CM | POA: Diagnosis not present

## 2020-12-06 ENCOUNTER — Ambulatory Visit (INDEPENDENT_AMBULATORY_CARE_PROVIDER_SITE_OTHER): Payer: No Typology Code available for payment source

## 2020-12-06 DIAGNOSIS — J309 Allergic rhinitis, unspecified: Secondary | ICD-10-CM

## 2020-12-21 ENCOUNTER — Ambulatory Visit (INDEPENDENT_AMBULATORY_CARE_PROVIDER_SITE_OTHER): Payer: No Typology Code available for payment source

## 2020-12-21 DIAGNOSIS — J309 Allergic rhinitis, unspecified: Secondary | ICD-10-CM

## 2020-12-28 ENCOUNTER — Ambulatory Visit (INDEPENDENT_AMBULATORY_CARE_PROVIDER_SITE_OTHER): Payer: No Typology Code available for payment source

## 2020-12-28 DIAGNOSIS — J309 Allergic rhinitis, unspecified: Secondary | ICD-10-CM | POA: Diagnosis not present

## 2021-01-03 ENCOUNTER — Ambulatory Visit (INDEPENDENT_AMBULATORY_CARE_PROVIDER_SITE_OTHER): Payer: No Typology Code available for payment source

## 2021-01-03 DIAGNOSIS — J309 Allergic rhinitis, unspecified: Secondary | ICD-10-CM | POA: Diagnosis not present

## 2021-01-10 ENCOUNTER — Ambulatory Visit (INDEPENDENT_AMBULATORY_CARE_PROVIDER_SITE_OTHER): Payer: No Typology Code available for payment source

## 2021-01-10 DIAGNOSIS — J309 Allergic rhinitis, unspecified: Secondary | ICD-10-CM

## 2021-01-17 ENCOUNTER — Ambulatory Visit (INDEPENDENT_AMBULATORY_CARE_PROVIDER_SITE_OTHER): Payer: No Typology Code available for payment source

## 2021-01-17 DIAGNOSIS — J309 Allergic rhinitis, unspecified: Secondary | ICD-10-CM

## 2021-02-07 ENCOUNTER — Ambulatory Visit (INDEPENDENT_AMBULATORY_CARE_PROVIDER_SITE_OTHER): Payer: No Typology Code available for payment source

## 2021-02-07 DIAGNOSIS — J309 Allergic rhinitis, unspecified: Secondary | ICD-10-CM

## 2021-02-22 ENCOUNTER — Ambulatory Visit (INDEPENDENT_AMBULATORY_CARE_PROVIDER_SITE_OTHER): Payer: No Typology Code available for payment source

## 2021-02-22 DIAGNOSIS — J309 Allergic rhinitis, unspecified: Secondary | ICD-10-CM | POA: Diagnosis not present

## 2021-03-08 DIAGNOSIS — J3089 Other allergic rhinitis: Secondary | ICD-10-CM

## 2021-03-08 NOTE — Progress Notes (Signed)
VIAL MADE. EXP 03-08-22

## 2021-03-14 ENCOUNTER — Ambulatory Visit (INDEPENDENT_AMBULATORY_CARE_PROVIDER_SITE_OTHER): Payer: No Typology Code available for payment source

## 2021-03-14 DIAGNOSIS — J309 Allergic rhinitis, unspecified: Secondary | ICD-10-CM | POA: Diagnosis not present

## 2021-04-05 ENCOUNTER — Ambulatory Visit (INDEPENDENT_AMBULATORY_CARE_PROVIDER_SITE_OTHER): Payer: No Typology Code available for payment source

## 2021-04-05 DIAGNOSIS — J309 Allergic rhinitis, unspecified: Secondary | ICD-10-CM

## 2021-04-21 ENCOUNTER — Ambulatory Visit (INDEPENDENT_AMBULATORY_CARE_PROVIDER_SITE_OTHER): Payer: No Typology Code available for payment source

## 2021-04-21 DIAGNOSIS — J309 Allergic rhinitis, unspecified: Secondary | ICD-10-CM | POA: Diagnosis not present

## 2021-05-12 ENCOUNTER — Ambulatory Visit (INDEPENDENT_AMBULATORY_CARE_PROVIDER_SITE_OTHER): Payer: No Typology Code available for payment source

## 2021-05-12 DIAGNOSIS — J309 Allergic rhinitis, unspecified: Secondary | ICD-10-CM

## 2021-05-19 ENCOUNTER — Ambulatory Visit (INDEPENDENT_AMBULATORY_CARE_PROVIDER_SITE_OTHER): Payer: No Typology Code available for payment source

## 2021-05-19 DIAGNOSIS — J309 Allergic rhinitis, unspecified: Secondary | ICD-10-CM | POA: Diagnosis not present

## 2021-05-26 ENCOUNTER — Ambulatory Visit (INDEPENDENT_AMBULATORY_CARE_PROVIDER_SITE_OTHER): Payer: No Typology Code available for payment source

## 2021-05-26 DIAGNOSIS — J309 Allergic rhinitis, unspecified: Secondary | ICD-10-CM | POA: Diagnosis not present

## 2021-06-06 ENCOUNTER — Ambulatory Visit (INDEPENDENT_AMBULATORY_CARE_PROVIDER_SITE_OTHER): Payer: No Typology Code available for payment source

## 2021-06-06 DIAGNOSIS — J309 Allergic rhinitis, unspecified: Secondary | ICD-10-CM | POA: Diagnosis not present

## 2021-06-13 ENCOUNTER — Ambulatory Visit (INDEPENDENT_AMBULATORY_CARE_PROVIDER_SITE_OTHER): Payer: No Typology Code available for payment source

## 2021-06-13 DIAGNOSIS — J309 Allergic rhinitis, unspecified: Secondary | ICD-10-CM | POA: Diagnosis not present

## 2021-06-30 ENCOUNTER — Ambulatory Visit (INDEPENDENT_AMBULATORY_CARE_PROVIDER_SITE_OTHER): Payer: No Typology Code available for payment source

## 2021-06-30 DIAGNOSIS — J309 Allergic rhinitis, unspecified: Secondary | ICD-10-CM

## 2021-07-12 DIAGNOSIS — J3089 Other allergic rhinitis: Secondary | ICD-10-CM | POA: Diagnosis not present

## 2021-07-12 NOTE — Progress Notes (Signed)
Exp 07/12/22 

## 2021-07-16 IMAGING — DX DG CHEST 1V PORT
1 series · 1 of 1 positions shown · non-contrast
Comparison: none

CLINICAL DATA: Dx w/ Covid 5 days ago; symptoms began 6 days ago;
NO SOB; NO chest pain; No difficulty breathing; pt just feeling
weak; fever yesterday

EXAM:
PORTABLE CHEST - 1 VIEW

[chest ap]
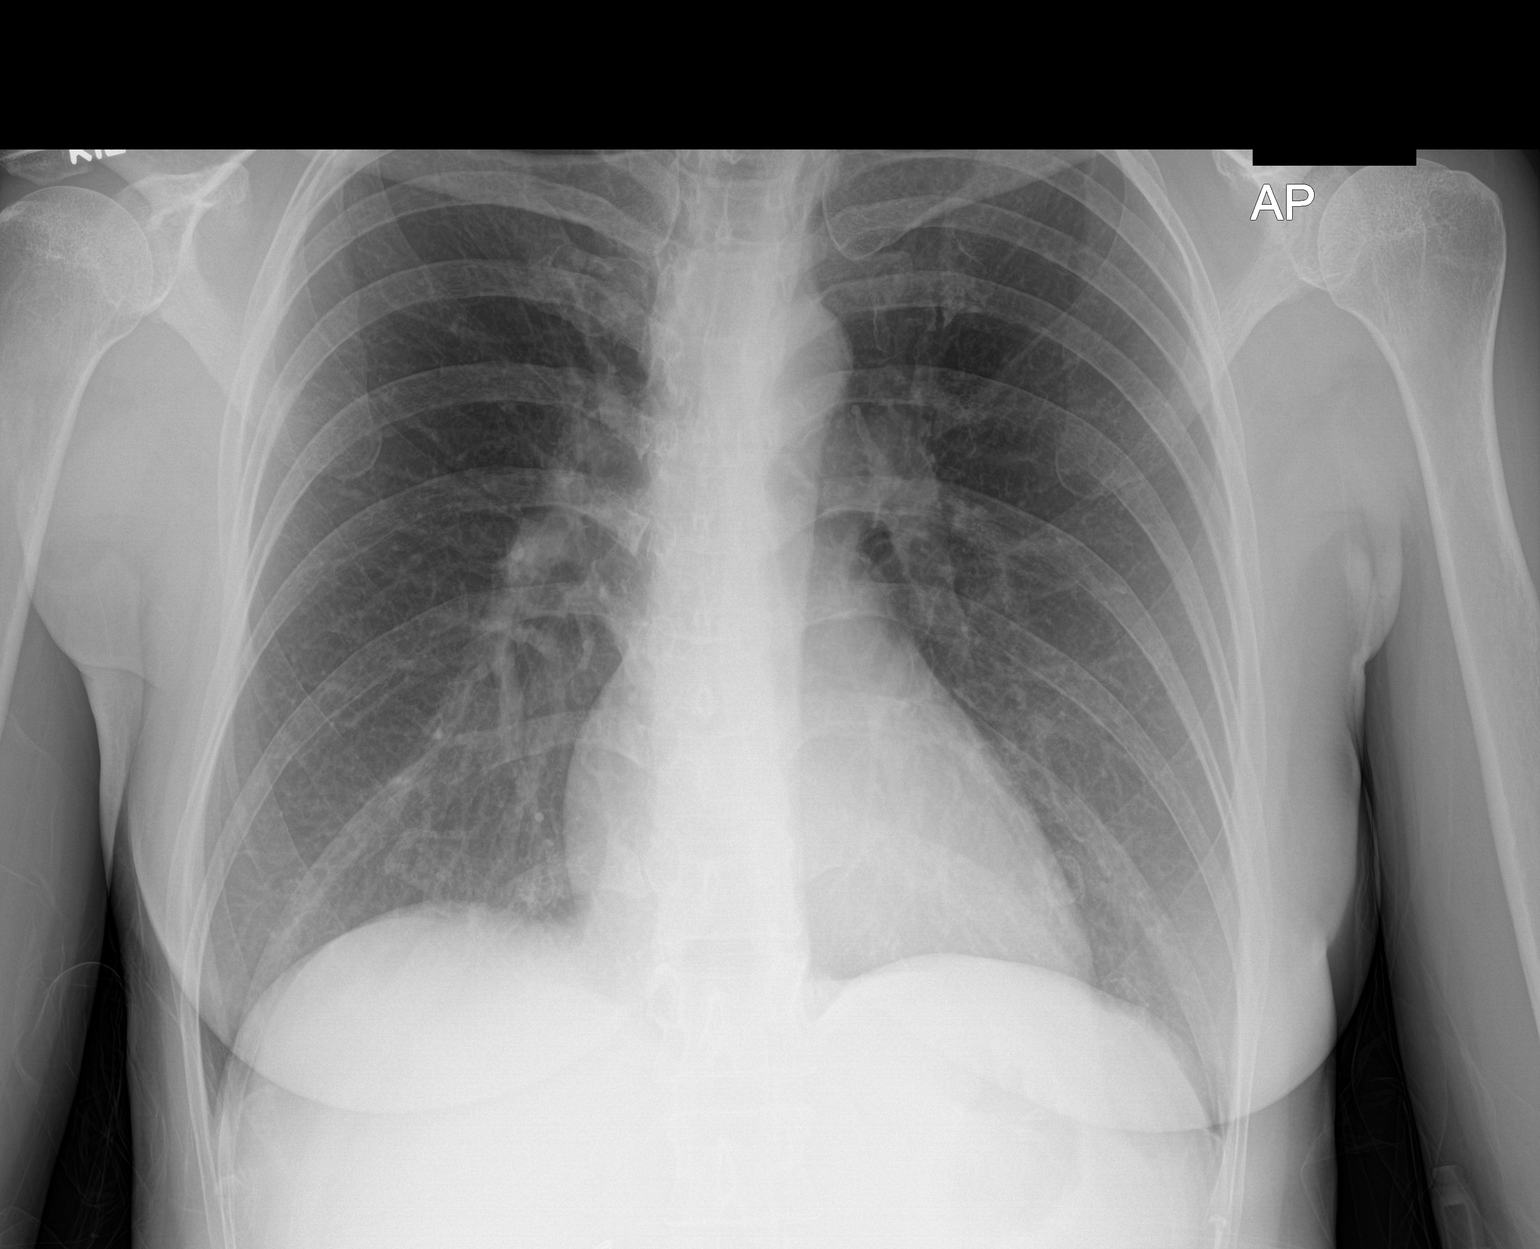

[1 of 1 positions shown; findings below may reference images not displayed]

FINDINGS: Lungs are clear.

Heart size and mediastinal contours are within normal limits.

No effusion.

Visualized bones unremarkable.
IMPRESSION: No acute cardiopulmonary disease.

## 2021-07-22 ENCOUNTER — Ambulatory Visit (INDEPENDENT_AMBULATORY_CARE_PROVIDER_SITE_OTHER): Payer: No Typology Code available for payment source

## 2021-07-22 DIAGNOSIS — J309 Allergic rhinitis, unspecified: Secondary | ICD-10-CM

## 2021-08-12 ENCOUNTER — Ambulatory Visit (INDEPENDENT_AMBULATORY_CARE_PROVIDER_SITE_OTHER): Payer: No Typology Code available for payment source

## 2021-08-12 DIAGNOSIS — J309 Allergic rhinitis, unspecified: Secondary | ICD-10-CM

## 2021-09-01 ENCOUNTER — Ambulatory Visit (INDEPENDENT_AMBULATORY_CARE_PROVIDER_SITE_OTHER): Payer: No Typology Code available for payment source

## 2021-09-01 DIAGNOSIS — J309 Allergic rhinitis, unspecified: Secondary | ICD-10-CM | POA: Diagnosis not present

## 2021-09-14 ENCOUNTER — Other Ambulatory Visit: Payer: Self-pay

## 2021-09-22 ENCOUNTER — Ambulatory Visit (INDEPENDENT_AMBULATORY_CARE_PROVIDER_SITE_OTHER): Payer: No Typology Code available for payment source

## 2021-09-22 DIAGNOSIS — J309 Allergic rhinitis, unspecified: Secondary | ICD-10-CM | POA: Diagnosis not present

## 2021-09-26 ENCOUNTER — Other Ambulatory Visit: Payer: Self-pay | Admitting: *Deleted

## 2021-10-03 ENCOUNTER — Other Ambulatory Visit: Payer: Self-pay | Admitting: *Deleted

## 2021-10-03 MED ORDER — XHANCE 93 MCG/ACT NA EXHU: INHALANT_SUSPENSION | NASAL | 0 refills | Status: DC

## 2021-10-12 ENCOUNTER — Telehealth: Payer: Self-pay | Admitting: Family Medicine

## 2021-10-12 NOTE — Telephone Encounter (Signed)
Representative called to see if our office received request for prior authorization to be done for patient's Xhance. Request was sent 10-05-2021 to our office. Representative would like a call back regarding this, [phone]

## 2021-10-12 NOTE — Telephone Encounter (Signed)
Have not received any fax so asked [employer] to send me fax back and verified number

## 2021-10-13 ENCOUNTER — Ambulatory Visit (INDEPENDENT_AMBULATORY_CARE_PROVIDER_SITE_OTHER): Payer: No Typology Code available for payment source | Admitting: *Deleted

## 2021-10-13 DIAGNOSIS — J309 Allergic rhinitis, unspecified: Secondary | ICD-10-CM

## 2021-10-13 NOTE — Telephone Encounter (Signed)
Pa submitted thru cover my meds for xhance waiting on results

## 2021-10-14 NOTE — Telephone Encounter (Signed)
Pa was denied per:The denial was based on our criteria for Xhance Mis .  Per your health plan's criteria, this drug is covered if you meet the following: (1) Your doctor provides your medical records (for example: chart notes) showing the drug is being used for a Food and Drug Administration (FDA)-approved indication. (2) There are paid claims or your doctor provides medical records (for example: chart notes) showing that you have tried all covered (formulary) drug (if request is for a combination product, you must have documentation indicating concurrent use of separate drugs). Covered drugs include Beconase AQ, generic mometasone. (3) Documentation has been provided stating the covered (formulary) drug have not been working for you (have not been effective). (4) There are clinical reasons why the requested drug would work for your condition and the covered (formulary) drug would not (despite having the same active ingredient and/or same mechanism of action). The information provided does not show that you meet the criteria listed above. Please speak with your doctor about your choices.

## 2021-10-17 NOTE — Telephone Encounter (Signed)
I guess we need to take a step backwards and start with Beconase AQ 2 sprays in each nostril twice a day. Please have her call in 30 days to let us reassess her progress. Thank you

## 2021-10-17 NOTE — Telephone Encounter (Signed)
Pt informed of this and has an appt this week with anne ambs fnp and will talk to her in person about it and how she I doing fine being out of xhance

## 2021-10-20 ENCOUNTER — Ambulatory Visit (INDEPENDENT_AMBULATORY_CARE_PROVIDER_SITE_OTHER): Payer: No Typology Code available for payment source

## 2021-10-20 DIAGNOSIS — J309 Allergic rhinitis, unspecified: Secondary | ICD-10-CM

## 2021-10-26 ENCOUNTER — Encounter: Payer: Self-pay | Admitting: Family Medicine

## 2021-10-26 ENCOUNTER — Other Ambulatory Visit: Payer: Self-pay

## 2021-10-26 ENCOUNTER — Ambulatory Visit: Payer: No Typology Code available for payment source | Admitting: Family Medicine

## 2021-10-26 VITALS — BP 108/58 | HR 72 | Temp 97.9°F | Resp 16 | Ht 67.0 in | Wt 143.2 lb

## 2021-10-26 DIAGNOSIS — J4530 Mild persistent asthma, uncomplicated: Secondary | ICD-10-CM

## 2021-10-26 DIAGNOSIS — H1013 Acute atopic conjunctivitis, bilateral: Secondary | ICD-10-CM

## 2021-10-26 DIAGNOSIS — J309 Allergic rhinitis, unspecified: Secondary | ICD-10-CM

## 2021-10-26 DIAGNOSIS — J302 Other seasonal allergic rhinitis: Secondary | ICD-10-CM

## 2021-10-26 MED ORDER — EPINEPHRINE 0.3 MG/0.3ML IJ SOAJ: INTRAMUSCULAR | 5 refills | Status: DC

## 2021-10-26 NOTE — Progress Notes (Addendum)
[place] [id number] Dept: [phone]  FOLLOW UP NOTE  Patient ID: Kelsey Benjamin, female    DOB: [birth year]  Age: 51 y.o. MRN: [id number] Date of Office Visit: 10/26/2021  Assessment  Chief Complaint: Follow-up, Asthma (Pt states her symptoms are well managed, no flare up since LOV, she had stop taking her xhance due to no coverage,), and Allergic Rhinitis   HPI Kelsey Benjamin is a 51 year old female who presents to the clinic for follow-up visit.  She was last seen in this clinic on 05/26/2020 by Gareth Morgan, FNP, for evaluation of asthma, allergic rhinitis on allergen immunotherapy, and allergic conjunctivitis.  At today's visit, she reports her asthma has been well controlled with rare shortness of breath with activity.  She denies shortness of breath, cough, or wheeze with moderate activity or rest.  She denies nighttime asthma symptoms.  She rarely uses her albuterol and has not needed to use Flovent 110 since her last visit to this clinic.  Allergic rhinitis is reported as moderately well controlled with symptoms including occasional sneeze and clear rhinorrhea which occurs year-round and recently has gotten worse while eating or drinking.  She continues cetirizine 10 mg once a day and recently stopped using Xhance due to cost.  She is not currently using a nasal steroid spray, nasal antihistamine, or nasal saline rinses.  She does report that she has tried switching antihistamines, however, cetirizine has been the most effective to date.  She continues allergen immunotherapy with no large or local reactions.  She reports a significant decrease in her symptoms of allergic rhinitis while continuing on allergen immunotherapy.  Allergic conjunctivitis is reported as moderately well controlled with an over-the-counter allergy eyedrop as needed with relief of symptoms.  Her current medications are listed in the chart.   Drug Allergies:  Allergies  Allergen Reactions    Doxycycline Rash    Physical Exam: BP (!) 108/58    Pulse 72    Temp 97.9 F (36.6 C) (Temporal)    Resp 16    Ht 5\' 7"  (1.702 m)    Wt 143 lb 3.2 oz (65 kg)    SpO2 97%    BMI 22.43 kg/m    Physical Exam Vitals reviewed.  Constitutional:      Appearance: Normal appearance.  HENT:     Head: Normocephalic and atraumatic.     Right Ear: Tympanic membrane normal.     Left Ear: Tympanic membrane normal.     Nose:     Comments: Bilateral nares slightly erythematous with clear nasal drainage noted.  Septal deviation noted.  Pharynx normal.  Ears normal.  Eyes normal.    Mouth/Throat:     Pharynx: Oropharynx is clear.  Eyes:     Conjunctiva/sclera: Conjunctivae normal.  Cardiovascular:     Rate and Rhythm: Normal rate and regular rhythm.     Heart sounds: Normal heart sounds. No murmur heard. Pulmonary:     Effort: Pulmonary effort is normal.     Breath sounds: Normal breath sounds.     Comments: Lungs clear to auscultation Musculoskeletal:        General: Normal range of motion.     Cervical back: Normal range of motion and neck supple.  Skin:    General: Skin is warm and dry.  Neurological:     Mental Status: She is alert and oriented to person, place, and time.  Psychiatric:        Mood and Affect: Mood  normal.        Behavior: Behavior normal.        Thought Content: Thought content normal.        Judgment: Judgment normal.    Diagnostics: FVC 4.09, FEV1 3.08.  Predicted FVC 3.85, predicted FEV1 3.07.  Spirometry indicates normal ventilatory function.  Assessment and Plan: 1. Mild persistent asthma without complication   2. Seasonal and perennial allergic rhinitis   3. Allergic conjunctivitis of both eyes     Meds ordered this encounter  Medications   EPINEPHrine (AUVI-Q) 0.3 mg/0.3 mL IJ SOAJ injection    Sig: Use as directed for severe allergic reactions.    Dispense:  1 each    Refill:  5    Patient Instructions  Asthma Continue albuterol 2 puffs every 4  hours as needed for cough or wheeze You may use albuterol 2 puffs 5 to 15 minutes before activity to decrease cough or wheeze For asthma flare, begin Flovent 110-2 puffs twice a day with a spacer for 2 weeks or until cough and wheeze free  Allergic rhinitis Continue allergen avoidance measures directed toward grass pollen, weed pollen, mold, and dust mite as listed below Continue allergen immunotherapy and have access to an epinephrine auto-injector set Continue cetirizine 10 mg once a day as needed for runny nose or itch Continue azelastine 1 to 2 sprays in each nostril twice a day as needed for runny nose Continue Atrovent 2 sprays in each nostril twice a day as needed for runny nose Continue Flonase 2 sprays in each nostril once a day as needed for stuffy nose Consider saline nasal rinses as needed for nasal symptoms. Use this before any medicated nasal sprays for best result Ryaltris sample was given in the office today.  Use Ryaltrys 2 sprays in each nostril twice a day for nasal symptoms.  Ryaltrys contains an antihistamine and a nasal steroid.  When using this medication do not use azelastine or Flonase.  Call the clinic if you would like [place] to order Ryaltrys  Allergic conjunctivitis Continue antihistamine eye drops as needed for red, itchy eyes. Some over the counter eye drops include Pataday one drop in each eye once a day as needed for red, itchy eyes OR Zaditor one drop in each eye twice a day as needed for red itchy eyes.  Call the clinic if this treatment plan is not working well for you  Follow up in 6 months or sooner if needed.   Return in about 6 months (around 04/25/2022), or if symptoms worsen or fail to improve.    Thank you for the opportunity to care for this patient.  Please do not hesitate to contact me with questions.  Gareth Morgan, FNP [facility]

## 2021-10-26 NOTE — Patient Instructions (Addendum)
Asthma Continue albuterol 2 puffs every 4 hours as needed for cough or wheeze You may use albuterol 2 puffs 5 to 15 minutes before activity to decrease cough or wheeze For asthma flare, begin Flovent 110-2 puffs twice a day with a spacer for 2 weeks or until cough and wheeze free  Allergic rhinitis Continue allergen avoidance measures directed toward grass pollen, weed pollen, mold, and dust mite as listed below Continue allergen immunotherapy and have access to an epinephrine auto-injector set Continue cetirizine 10 mg once a day as needed for runny nose or itch Continue azelastine 1 to 2 sprays in each nostril twice a day as needed for runny nose Continue Atrovent 2 sprays in each nostril twice a day as needed for runny nose Continue Flonase 2 sprays in each nostril once a day as needed for stuffy nose Consider saline nasal rinses as needed for nasal symptoms. Use this before any medicated nasal sprays for best result Ryaltris sample was given in the office today.  Use Ryaltrys 2 sprays in each nostril twice a day for nasal symptoms.  Ryaltrys contains an antihistamine and a nasal steroid.  When using this medication do not use azelastine or Flonase.  Call the clinic if you would like [place] to order Ryaltrys  Allergic conjunctivitis Continue antihistamine eye drops as needed for red, itchy eyes. Some over the counter eye drops include Pataday one drop in each eye once a day as needed for red, itchy eyes OR Zaditor one drop in each eye twice a day as needed for red itchy eyes.  Call the clinic if this treatment plan is not working well for you  Follow up in 6 months or sooner if needed.  Reducing Pollen Exposure The American Academy of Allergy, Asthma and Immunology suggests the following steps to reduce your exposure to pollen during allergy seasons. Do not hang sheets or clothing out to dry; pollen may collect on these items. Do not mow lawns or spend time around freshly cut grass; mowing  stirs up pollen. Keep windows closed at night.  Keep car windows closed while driving. Minimize morning activities outdoors, a time when pollen counts are usually at their highest. Stay indoors as much as possible when pollen counts or humidity is high and on windy days when pollen tends to remain in the air longer. Use air conditioning when possible.  Many air conditioners have filters that trap the pollen spores. Use a HEPA room air filter to remove pollen form the indoor air you breathe.   Control of Dust Mite Allergen Dust mites play a major role in allergic asthma and rhinitis. They occur in environments with high humidity wherever human skin is found. Dust mites absorb humidity from the atmosphere (ie, they do not drink) and feed on organic matter (including shed human and animal skin). Dust mites are a microscopic type of insect that you cannot see with the naked eye. High levels of dust mites have been detected from mattresses, pillows, carpets, upholstered furniture, bed covers, clothes, soft toys and any woven material. The principal allergen of the dust mite is found in its feces. A gram of dust may contain 1,000 mites and 250,000 fecal particles. Mite antigen is easily measured in the air during house cleaning activities. Dust mites do not bite and do not cause harm to humans, other than by triggering allergies/asthma.  Ways to decrease your exposure to dust mites in your home:  1. Encase mattresses, box springs and pillows with a mite-impermeable  barrier or cover  2. Wash sheets, blankets and drapes weekly in hot water (130 F) with detergent and dry them in a dryer on the hot setting.  3. Have the room cleaned frequently with a vacuum cleaner and a damp dust-mop. For carpeting or rugs, vacuuming with a vacuum cleaner equipped with a high-efficiency particulate air (HEPA) filter. The dust mite allergic individual should not be in a room which is being cleaned and should wait 1 hour  after cleaning before going into the room.  4. Do not sleep on upholstered furniture (eg, couches).  5. If possible removing carpeting, upholstered furniture and drapery from the home is ideal. Horizontal blinds should be eliminated in the rooms where the person spends the most time (bedroom, study, television room). Washable vinyl, roller-type shades are optimal.  6. Remove all non-washable stuffed toys from the bedroom. Wash stuffed toys weekly like sheets and blankets above.  7. Reduce indoor humidity to less than 50%. Inexpensive humidity monitors can be purchased at most hardware stores. Do not use a humidifier as can make the problem worse and are not recommended.

## 2021-11-03 ENCOUNTER — Ambulatory Visit (INDEPENDENT_AMBULATORY_CARE_PROVIDER_SITE_OTHER): Payer: No Typology Code available for payment source

## 2021-11-03 DIAGNOSIS — J309 Allergic rhinitis, unspecified: Secondary | ICD-10-CM | POA: Diagnosis not present

## 2021-11-14 ENCOUNTER — Ambulatory Visit (INDEPENDENT_AMBULATORY_CARE_PROVIDER_SITE_OTHER): Payer: No Typology Code available for payment source | Admitting: *Deleted

## 2021-11-14 DIAGNOSIS — J309 Allergic rhinitis, unspecified: Secondary | ICD-10-CM | POA: Diagnosis not present

## 2021-12-02 ENCOUNTER — Ambulatory Visit (INDEPENDENT_AMBULATORY_CARE_PROVIDER_SITE_OTHER): Payer: No Typology Code available for payment source

## 2021-12-02 DIAGNOSIS — J309 Allergic rhinitis, unspecified: Secondary | ICD-10-CM

## 2021-12-14 DIAGNOSIS — J3089 Other allergic rhinitis: Secondary | ICD-10-CM | POA: Diagnosis not present

## 2021-12-14 NOTE — Progress Notes (Signed)
EXP 12/15/22 ?

## 2021-12-23 ENCOUNTER — Ambulatory Visit (INDEPENDENT_AMBULATORY_CARE_PROVIDER_SITE_OTHER): Payer: No Typology Code available for payment source

## 2021-12-23 DIAGNOSIS — J309 Allergic rhinitis, unspecified: Secondary | ICD-10-CM | POA: Diagnosis not present

## 2022-01-05 ENCOUNTER — Ambulatory Visit (INDEPENDENT_AMBULATORY_CARE_PROVIDER_SITE_OTHER): Payer: No Typology Code available for payment source

## 2022-01-05 DIAGNOSIS — J309 Allergic rhinitis, unspecified: Secondary | ICD-10-CM | POA: Diagnosis not present

## 2022-01-26 ENCOUNTER — Ambulatory Visit (INDEPENDENT_AMBULATORY_CARE_PROVIDER_SITE_OTHER): Payer: No Typology Code available for payment source

## 2022-01-26 DIAGNOSIS — J309 Allergic rhinitis, unspecified: Secondary | ICD-10-CM | POA: Diagnosis not present

## 2022-02-17 ENCOUNTER — Ambulatory Visit (INDEPENDENT_AMBULATORY_CARE_PROVIDER_SITE_OTHER): Payer: No Typology Code available for payment source

## 2022-02-17 DIAGNOSIS — J309 Allergic rhinitis, unspecified: Secondary | ICD-10-CM

## 2022-03-13 ENCOUNTER — Ambulatory Visit (INDEPENDENT_AMBULATORY_CARE_PROVIDER_SITE_OTHER): Payer: No Typology Code available for payment source

## 2022-03-13 DIAGNOSIS — J309 Allergic rhinitis, unspecified: Secondary | ICD-10-CM | POA: Diagnosis not present

## 2022-03-21 ENCOUNTER — Ambulatory Visit (INDEPENDENT_AMBULATORY_CARE_PROVIDER_SITE_OTHER): Payer: No Typology Code available for payment source

## 2022-03-21 DIAGNOSIS — J309 Allergic rhinitis, unspecified: Secondary | ICD-10-CM

## 2022-03-29 ENCOUNTER — Ambulatory Visit (INDEPENDENT_AMBULATORY_CARE_PROVIDER_SITE_OTHER): Payer: No Typology Code available for payment source

## 2022-03-29 DIAGNOSIS — J309 Allergic rhinitis, unspecified: Secondary | ICD-10-CM | POA: Diagnosis not present

## 2022-04-07 ENCOUNTER — Ambulatory Visit (INDEPENDENT_AMBULATORY_CARE_PROVIDER_SITE_OTHER): Payer: No Typology Code available for payment source | Admitting: *Deleted

## 2022-04-07 DIAGNOSIS — J309 Allergic rhinitis, unspecified: Secondary | ICD-10-CM

## 2022-04-13 ENCOUNTER — Ambulatory Visit (INDEPENDENT_AMBULATORY_CARE_PROVIDER_SITE_OTHER): Payer: No Typology Code available for payment source

## 2022-04-13 DIAGNOSIS — J309 Allergic rhinitis, unspecified: Secondary | ICD-10-CM

## 2022-05-05 ENCOUNTER — Ambulatory Visit (INDEPENDENT_AMBULATORY_CARE_PROVIDER_SITE_OTHER): Payer: No Typology Code available for payment source

## 2022-05-05 DIAGNOSIS — J309 Allergic rhinitis, unspecified: Secondary | ICD-10-CM | POA: Diagnosis not present

## 2022-05-25 ENCOUNTER — Ambulatory Visit (INDEPENDENT_AMBULATORY_CARE_PROVIDER_SITE_OTHER): Payer: No Typology Code available for payment source

## 2022-05-25 DIAGNOSIS — J309 Allergic rhinitis, unspecified: Secondary | ICD-10-CM | POA: Diagnosis not present

## 2022-05-29 DIAGNOSIS — J3089 Other allergic rhinitis: Secondary | ICD-10-CM | POA: Diagnosis not present

## 2022-05-29 NOTE — Progress Notes (Signed)
VIAL EXP 05-30-23 

## 2022-06-12 ENCOUNTER — Ambulatory Visit (INDEPENDENT_AMBULATORY_CARE_PROVIDER_SITE_OTHER): Payer: No Typology Code available for payment source

## 2022-06-12 DIAGNOSIS — J309 Allergic rhinitis, unspecified: Secondary | ICD-10-CM | POA: Diagnosis not present

## 2022-07-11 ENCOUNTER — Ambulatory Visit (INDEPENDENT_AMBULATORY_CARE_PROVIDER_SITE_OTHER): Payer: No Typology Code available for payment source

## 2022-07-11 DIAGNOSIS — J309 Allergic rhinitis, unspecified: Secondary | ICD-10-CM

## 2022-08-09 ENCOUNTER — Ambulatory Visit (INDEPENDENT_AMBULATORY_CARE_PROVIDER_SITE_OTHER): Payer: No Typology Code available for payment source

## 2022-08-09 DIAGNOSIS — J309 Allergic rhinitis, unspecified: Secondary | ICD-10-CM

## 2022-09-11 ENCOUNTER — Ambulatory Visit (INDEPENDENT_AMBULATORY_CARE_PROVIDER_SITE_OTHER): Payer: No Typology Code available for payment source

## 2022-09-11 DIAGNOSIS — J309 Allergic rhinitis, unspecified: Secondary | ICD-10-CM

## 2022-10-09 ENCOUNTER — Ambulatory Visit (INDEPENDENT_AMBULATORY_CARE_PROVIDER_SITE_OTHER): Payer: No Typology Code available for payment source

## 2022-10-09 DIAGNOSIS — J309 Allergic rhinitis, unspecified: Secondary | ICD-10-CM

## 2022-10-16 ENCOUNTER — Ambulatory Visit (INDEPENDENT_AMBULATORY_CARE_PROVIDER_SITE_OTHER): Payer: No Typology Code available for payment source

## 2022-10-16 DIAGNOSIS — J309 Allergic rhinitis, unspecified: Secondary | ICD-10-CM

## 2022-10-23 ENCOUNTER — Ambulatory Visit (INDEPENDENT_AMBULATORY_CARE_PROVIDER_SITE_OTHER): Payer: No Typology Code available for payment source

## 2022-10-23 DIAGNOSIS — J309 Allergic rhinitis, unspecified: Secondary | ICD-10-CM | POA: Diagnosis not present

## 2022-10-30 ENCOUNTER — Ambulatory Visit (INDEPENDENT_AMBULATORY_CARE_PROVIDER_SITE_OTHER): Payer: No Typology Code available for payment source

## 2022-10-30 DIAGNOSIS — J309 Allergic rhinitis, unspecified: Secondary | ICD-10-CM

## 2022-11-28 ENCOUNTER — Ambulatory Visit (INDEPENDENT_AMBULATORY_CARE_PROVIDER_SITE_OTHER): Payer: No Typology Code available for payment source

## 2022-11-28 DIAGNOSIS — J309 Allergic rhinitis, unspecified: Secondary | ICD-10-CM | POA: Diagnosis not present

## 2022-12-11 ENCOUNTER — Ambulatory Visit (INDEPENDENT_AMBULATORY_CARE_PROVIDER_SITE_OTHER): Payer: No Typology Code available for payment source

## 2022-12-11 DIAGNOSIS — J309 Allergic rhinitis, unspecified: Secondary | ICD-10-CM

## 2023-01-05 ENCOUNTER — Ambulatory Visit (INDEPENDENT_AMBULATORY_CARE_PROVIDER_SITE_OTHER): Payer: No Typology Code available for payment source

## 2023-01-05 DIAGNOSIS — J309 Allergic rhinitis, unspecified: Secondary | ICD-10-CM

## 2023-01-30 ENCOUNTER — Ambulatory Visit (INDEPENDENT_AMBULATORY_CARE_PROVIDER_SITE_OTHER): Payer: No Typology Code available for payment source

## 2023-01-30 DIAGNOSIS — J309 Allergic rhinitis, unspecified: Secondary | ICD-10-CM | POA: Diagnosis not present

## 2023-02-05 DIAGNOSIS — J301 Allergic rhinitis due to pollen: Secondary | ICD-10-CM

## 2023-02-05 NOTE — Progress Notes (Signed)
VIAL EXP 02-05-24 

## 2023-02-27 ENCOUNTER — Ambulatory Visit (INDEPENDENT_AMBULATORY_CARE_PROVIDER_SITE_OTHER): Payer: No Typology Code available for payment source

## 2023-02-27 DIAGNOSIS — J309 Allergic rhinitis, unspecified: Secondary | ICD-10-CM

## 2023-03-16 DIAGNOSIS — J3089 Other allergic rhinitis: Secondary | ICD-10-CM | POA: Diagnosis not present

## 2023-03-16 NOTE — Progress Notes (Signed)
EXP 03/15/24

## 2023-05-10 ENCOUNTER — Ambulatory Visit: Payer: Self-pay

## 2023-05-14 ENCOUNTER — Ambulatory Visit (INDEPENDENT_AMBULATORY_CARE_PROVIDER_SITE_OTHER): Payer: No Typology Code available for payment source

## 2023-05-14 DIAGNOSIS — J309 Allergic rhinitis, unspecified: Secondary | ICD-10-CM

## 2023-05-28 ENCOUNTER — Ambulatory Visit (INDEPENDENT_AMBULATORY_CARE_PROVIDER_SITE_OTHER): Payer: No Typology Code available for payment source

## 2023-05-28 DIAGNOSIS — J309 Allergic rhinitis, unspecified: Secondary | ICD-10-CM | POA: Diagnosis not present

## 2023-06-04 ENCOUNTER — Ambulatory Visit (INDEPENDENT_AMBULATORY_CARE_PROVIDER_SITE_OTHER): Payer: No Typology Code available for payment source

## 2023-06-04 DIAGNOSIS — J309 Allergic rhinitis, unspecified: Secondary | ICD-10-CM

## 2023-06-12 ENCOUNTER — Ambulatory Visit (INDEPENDENT_AMBULATORY_CARE_PROVIDER_SITE_OTHER): Payer: No Typology Code available for payment source

## 2023-06-12 DIAGNOSIS — J309 Allergic rhinitis, unspecified: Secondary | ICD-10-CM | POA: Diagnosis not present

## 2023-06-19 ENCOUNTER — Ambulatory Visit (INDEPENDENT_AMBULATORY_CARE_PROVIDER_SITE_OTHER): Payer: No Typology Code available for payment source

## 2023-06-19 DIAGNOSIS — J309 Allergic rhinitis, unspecified: Secondary | ICD-10-CM | POA: Diagnosis not present

## 2023-06-23 NOTE — Patient Instructions (Incomplete)
Other form of dyspnea-shortness of breath with mask use May use albuterol 2 puffs every 4 hours as needed for cough or wheeze You may use albuterol 2 puffs 5 to 15 minutes before activity to decrease cough or wheeze Stop Flovent since you have not needed this in years  Asthma control goals:  Full participation in all desired activities (may need albuterol before activity) Albuterol use two time or less a week on average (not counting use with activity) Cough interfering with sleep two time or less a month Oral steroids no more than once a year No hospitalizations   Allergic rhinitis Continue allergen avoidance measures directed toward grass pollen, weed pollen, mold, and dust mite as listed below Continue allergen immunotherapy and have access to an epinephrine auto-injector set Continue cetirizine 10 mg once a day as needed for runny nose or itch Continue azelastine 1 to 2 sprays in each nostril twice a day as needed for runny nose Continue Atrovent 2 sprays in each nostril twice a day as needed for runny nose Continue Flonase 2 sprays in each nostril once a day as needed for stuffy nose Consider saline nasal rinses as needed for nasal symptoms. Use this before any medicated nasal sprays for best result   Allergic conjunctivitis Continue antihistamine eye drops as needed for red, itchy eyes. Some over the counter eye drops include Pataday one drop in each eye once a day as needed for red, itchy eyes OR Zaditor one drop in each eye twice a day as needed for red itchy eyes.  Call the clinic if this treatment plan is not working well for you  Follow up in 12 months or sooner if needed.  Reducing Pollen Exposure The American Academy of Allergy, Asthma and Immunology suggests the following steps to reduce your exposure to pollen during allergy seasons. Do not hang sheets or clothing out to dry; pollen may collect on these items. Do not mow lawns or spend time around freshly cut grass;  mowing stirs up pollen. Keep windows closed at night.  Keep car windows closed while driving. Minimize morning activities outdoors, a time when pollen counts are usually at their highest. Stay indoors as much as possible when pollen counts or humidity is high and on windy days when pollen tends to remain in the air longer. Use air conditioning when possible.  Many air conditioners have filters that trap the pollen spores. Use a HEPA room air filter to remove pollen form the indoor air you breathe.   Control of Dust Mite Allergen Dust mites play a major role in allergic asthma and rhinitis. They occur in environments with high humidity wherever human skin is found. Dust mites absorb humidity from the atmosphere (ie, they do not drink) and feed on organic matter (including shed human and animal skin). Dust mites are a microscopic type of insect that you cannot see with the naked eye. High levels of dust mites have been detected from mattresses, pillows, carpets, upholstered furniture, bed covers, clothes, soft toys and any woven material. The principal allergen of the dust mite is found in its feces. A gram of dust may contain 1,000 mites and 250,000 fecal particles. Mite antigen is easily measured in the air during house cleaning activities. Dust mites do not bite and do not cause harm to humans, other than by triggering allergies/asthma.  Ways to decrease your exposure to dust mites in your home:  1. Encase mattresses, box springs and pillows with a mite-impermeable barrier or cover  2. Wash sheets, blankets and drapes weekly in hot water (130 F) with detergent and dry them in a dryer on the hot setting.  3. Have the room cleaned frequently with a vacuum cleaner and a damp dust-mop. For carpeting or rugs, vacuuming with a vacuum cleaner equipped with a high-efficiency particulate air (HEPA) filter. The dust mite allergic individual should not be in a room which is being cleaned and should wait 1  hour after cleaning before going into the room.  4. Do not sleep on upholstered furniture (eg, couches).  5. If possible removing carpeting, upholstered furniture and drapery from the home is ideal. Horizontal blinds should be eliminated in the rooms where the person spends the most time (bedroom, study, television room). Washable vinyl, roller-type shades are optimal.  6. Remove all non-washable stuffed toys from the bedroom. Wash stuffed toys weekly like sheets and blankets above.  7. Reduce indoor humidity to less than 50%. Inexpensive humidity monitors can be purchased at most hardware stores. Do not use a humidifier as can make the problem worse and are not recommended.

## 2023-06-25 ENCOUNTER — Encounter: Payer: Self-pay | Admitting: Family

## 2023-06-25 ENCOUNTER — Ambulatory Visit: Payer: No Typology Code available for payment source | Admitting: Family

## 2023-06-25 VITALS — BP 102/68 | HR 76 | Temp 97.9°F | Resp 16

## 2023-06-25 DIAGNOSIS — H1013 Acute atopic conjunctivitis, bilateral: Secondary | ICD-10-CM | POA: Diagnosis not present

## 2023-06-25 DIAGNOSIS — J302 Other seasonal allergic rhinitis: Secondary | ICD-10-CM | POA: Diagnosis not present

## 2023-06-25 DIAGNOSIS — R0609 Other forms of dyspnea: Secondary | ICD-10-CM

## 2023-06-25 DIAGNOSIS — J3089 Other allergic rhinitis: Secondary | ICD-10-CM

## 2023-06-25 MED ORDER — CETIRIZINE HCL 10 MG PO TABS: 10.0000 mg | ORAL_TABLET | Freq: Every day | ORAL | 11 refills | Status: AC

## 2023-06-25 MED ORDER — EPINEPHRINE 0.3 MG/0.3ML IJ SOAJ: INTRAMUSCULAR | 1 refills | Status: DC

## 2023-06-25 NOTE — Progress Notes (Signed)
[address] [place] [address] Dept: [phone]  FOLLOW UP NOTE  Patient ID: Kelsey Benjamin, female    DOB: 09-17-1970  Age: 52 y.o. MRN: [id number] Date of Office Visit: 06/25/2023  Assessment  Chief Complaint: Allergies  HPI Josue Couturier is a 52 year old female who presents today for follow-up of mild persistent asthma without complication, seasonal and perennial allergic rhinitis, and allergic conjunctivitis.  She was last seen on October 26, 2021 by Thermon Leyland, FNP.  She denies any new diagnosis or surgery since her last office visit.  Asthma: She reports that she only had shortness of breath with wearing a mask.  After reviewing all her previous spirometry testing, they were all normal.  She reports that she has not used albuterol several years.  She has not used Flovent 110 mcg for flares in a long time.  She denies cough, wheeze, tightness in chest, shortness of breath, and nocturnal awakenings due to breathing problems.  Since her last office visit she has not required any systemic steroids or made any trips to the emergency room or urgent care due to breathing problems.  Allergic rhinitis: She reports that she has tried stopping allergy injections 2 times and has had to start back due to having symptoms.  She would like to continue allergy injections.  She does report that her allergy injections help.  She denies any large local reactions at the injection site.  She denies rhinorrhea, nasal congestion, postnasal drip.  She has not been treated for any sinus infections since we last saw her.  She continues to take Zyrtec 10 mg once a day.  She has not needed azelastine nasal spray, Atrovent nasal spray, or Flonase nasal spray.  Allergic conjunctivitis: She denies itchy watery eyes.   Drug Allergies:  Allergies  Allergen Reactions   Doxycycline Rash    Review of Systems: Negative except as per HPI.   Physical Exam: BP 102/68   Pulse 76   Temp 97.9 F (36.6  C) (Temporal)   Resp 16   SpO2 99%    Physical Exam Constitutional:      Appearance: Normal appearance.  HENT:     Head: Normocephalic and atraumatic.     Comments: Pharynx normal, eyes normal, ears normal, nose normal    Right Ear: Tympanic membrane, ear canal and external ear normal.     Left Ear: Tympanic membrane, ear canal and external ear normal.     Nose: Nose normal.     Mouth/Throat:     Mouth: Mucous membranes are moist.     Pharynx: Oropharynx is clear.  Eyes:     Conjunctiva/sclera: Conjunctivae normal.  Cardiovascular:     Rate and Rhythm: Regular rhythm.     Heart sounds: Normal heart sounds.  Pulmonary:     Effort: Pulmonary effort is normal.     Breath sounds: Normal breath sounds.     Comments: Lungs clear to auscultation Musculoskeletal:     Cervical back: Neck supple.  Skin:    General: Skin is warm.  Neurological:     Mental Status: She is alert and oriented to person, place, and time.  Psychiatric:        Mood and Affect: Mood normal.        Behavior: Behavior normal.        Thought Content: Thought content normal.        Judgment: Judgment normal.     Diagnostics:  None  Assessment and Plan: 1. Seasonal  and perennial allergic rhinitis   2. Allergic conjunctivitis of both eyes   3. Other form of dyspnea     Meds ordered this encounter  Medications   EPINEPHrine (AUVI-Q) 0.3 mg/0.3 mL IJ SOAJ injection    Sig: Use as directed for severe allergic reactions.    Dispense:  1 each    Refill:  1    [phone]   cetirizine (ZYRTEC ALLERGY) 10 MG tablet    Sig: Take 1 tablet (10 mg total) by mouth daily.    Dispense:  30 tablet    Refill:  11    Patient Instructions  Other form of dyspnea-shortness of breath with mask use May use albuterol 2 puffs every 4 hours as needed for cough or wheeze You may use albuterol 2 puffs 5 to 15 minutes before activity to decrease cough or wheeze Stop Flovent since you have not needed this in  years  Asthma control goals:  Full participation in all desired activities (may need albuterol before activity) Albuterol use two time or less a week on average (not counting use with activity) Cough interfering with sleep two time or less a month Oral steroids no more than once a year No hospitalizations   Allergic rhinitis Continue allergen avoidance measures directed toward grass pollen, weed pollen, mold, and dust mite as listed below Continue allergen immunotherapy and have access to an epinephrine auto-injector set Continue cetirizine 10 mg once a day as needed for runny nose or itch Continue azelastine 1 to 2 sprays in each nostril twice a day as needed for runny nose Continue Atrovent 2 sprays in each nostril twice a day as needed for runny nose Continue Flonase 2 sprays in each nostril once a day as needed for stuffy nose Consider saline nasal rinses as needed for nasal symptoms. Use this before any medicated nasal sprays for best result   Allergic conjunctivitis Continue antihistamine eye drops as needed for red, itchy eyes. Some over the counter eye drops include Pataday one drop in each eye once a day as needed for red, itchy eyes OR Zaditor one drop in each eye twice a day as needed for red itchy eyes.  Call the clinic if this treatment plan is not working well for you  Follow up in 12 months or sooner if needed.  Reducing Pollen Exposure The American Academy of Allergy, Asthma and Immunology suggests the following steps to reduce your exposure to pollen during allergy seasons. Do not hang sheets or clothing out to dry; pollen may collect on these items. Do not mow lawns or spend time around freshly cut grass; mowing stirs up pollen. Keep windows closed at night.  Keep car windows closed while driving. Minimize morning activities outdoors, a time when pollen counts are usually at their highest. Stay indoors as much as possible when pollen counts or humidity is high and  on windy days when pollen tends to remain in the air longer. Use air conditioning when possible.  Many air conditioners have filters that trap the pollen spores. Use a HEPA room air filter to remove pollen form the indoor air you breathe.   Control of Dust Mite Allergen Dust mites play a major role in allergic asthma and rhinitis. They occur in environments with high humidity wherever human skin is found. Dust mites absorb humidity from the atmosphere (ie, they do not drink) and feed on organic matter (including shed human and animal skin). Dust mites are a microscopic type of insect that you cannot  see with the naked eye. High levels of dust mites have been detected from mattresses, pillows, carpets, upholstered furniture, bed covers, clothes, soft toys and any woven material. The principal allergen of the dust mite is found in its feces. A gram of dust may contain 1,000 mites and 250,000 fecal particles. Mite antigen is easily measured in the air during house cleaning activities. Dust mites do not bite and do not cause harm to humans, other than by triggering allergies/asthma.  Ways to decrease your exposure to dust mites in your home:  1. Encase mattresses, box springs and pillows with a mite-impermeable barrier or cover  2. Wash sheets, blankets and drapes weekly in hot water (130 F) with detergent and dry them in a dryer on the hot setting.  3. Have the room cleaned frequently with a vacuum cleaner and a damp dust-mop. For carpeting or rugs, vacuuming with a vacuum cleaner equipped with a high-efficiency particulate air (HEPA) filter. The dust mite allergic individual should not be in a room which is being cleaned and should wait 1 hour after cleaning before going into the room.  4. Do not sleep on upholstered furniture (eg, couches).  5. If possible removing carpeting, upholstered furniture and drapery from the home is ideal. Horizontal blinds should be eliminated in the rooms where the  person spends the most time (bedroom, study, television room). Washable vinyl, roller-type shades are optimal.  6. Remove all non-washable stuffed toys from the bedroom. Wash stuffed toys weekly like sheets and blankets above.  7. Reduce indoor humidity to less than 50%. Inexpensive humidity monitors can be purchased at most hardware stores. Do not use a humidifier as can make the problem worse and are not recommended.   Return in about 1 year (around 06/24/2024), or if symptoms worsen or fail to improve.    Thank you for the opportunity to care for this patient.  Please do not hesitate to contact me with questions.  Nehemiah Settle, FNP [facility]

## 2023-07-24 ENCOUNTER — Ambulatory Visit (INDEPENDENT_AMBULATORY_CARE_PROVIDER_SITE_OTHER): Payer: No Typology Code available for payment source

## 2023-07-24 DIAGNOSIS — J309 Allergic rhinitis, unspecified: Secondary | ICD-10-CM

## 2023-08-21 ENCOUNTER — Ambulatory Visit (INDEPENDENT_AMBULATORY_CARE_PROVIDER_SITE_OTHER): Payer: Self-pay

## 2023-08-21 DIAGNOSIS — J309 Allergic rhinitis, unspecified: Secondary | ICD-10-CM | POA: Diagnosis not present

## 2023-09-25 ENCOUNTER — Ambulatory Visit (INDEPENDENT_AMBULATORY_CARE_PROVIDER_SITE_OTHER): Payer: No Typology Code available for payment source

## 2023-09-25 DIAGNOSIS — J309 Allergic rhinitis, unspecified: Secondary | ICD-10-CM | POA: Diagnosis not present

## 2023-10-23 ENCOUNTER — Ambulatory Visit (INDEPENDENT_AMBULATORY_CARE_PROVIDER_SITE_OTHER): Payer: Self-pay

## 2023-10-23 DIAGNOSIS — J309 Allergic rhinitis, unspecified: Secondary | ICD-10-CM

## 2023-11-21 ENCOUNTER — Ambulatory Visit (INDEPENDENT_AMBULATORY_CARE_PROVIDER_SITE_OTHER): Payer: Self-pay

## 2023-11-21 DIAGNOSIS — J309 Allergic rhinitis, unspecified: Secondary | ICD-10-CM | POA: Diagnosis not present

## 2023-11-28 ENCOUNTER — Ambulatory Visit (INDEPENDENT_AMBULATORY_CARE_PROVIDER_SITE_OTHER): Payer: Self-pay

## 2023-11-28 DIAGNOSIS — J309 Allergic rhinitis, unspecified: Secondary | ICD-10-CM

## 2023-12-06 ENCOUNTER — Ambulatory Visit (INDEPENDENT_AMBULATORY_CARE_PROVIDER_SITE_OTHER): Payer: Self-pay

## 2023-12-06 DIAGNOSIS — J309 Allergic rhinitis, unspecified: Secondary | ICD-10-CM | POA: Diagnosis not present

## 2024-01-09 ENCOUNTER — Ambulatory Visit (INDEPENDENT_AMBULATORY_CARE_PROVIDER_SITE_OTHER): Payer: Self-pay

## 2024-01-09 DIAGNOSIS — J309 Allergic rhinitis, unspecified: Secondary | ICD-10-CM

## 2024-02-05 ENCOUNTER — Ambulatory Visit (INDEPENDENT_AMBULATORY_CARE_PROVIDER_SITE_OTHER)

## 2024-02-05 DIAGNOSIS — J309 Allergic rhinitis, unspecified: Secondary | ICD-10-CM

## 2024-03-04 ENCOUNTER — Ambulatory Visit (INDEPENDENT_AMBULATORY_CARE_PROVIDER_SITE_OTHER): Payer: Self-pay

## 2024-03-04 DIAGNOSIS — J309 Allergic rhinitis, unspecified: Secondary | ICD-10-CM

## 2024-03-25 DIAGNOSIS — J3089 Other allergic rhinitis: Secondary | ICD-10-CM | POA: Diagnosis not present

## 2024-03-25 NOTE — Progress Notes (Signed)
 VIAL MADE 03-25-24

## 2024-04-02 ENCOUNTER — Ambulatory Visit (INDEPENDENT_AMBULATORY_CARE_PROVIDER_SITE_OTHER)

## 2024-04-02 DIAGNOSIS — J309 Allergic rhinitis, unspecified: Secondary | ICD-10-CM | POA: Diagnosis not present

## 2024-04-10 ENCOUNTER — Ambulatory Visit (INDEPENDENT_AMBULATORY_CARE_PROVIDER_SITE_OTHER): Payer: Self-pay

## 2024-04-10 DIAGNOSIS — J309 Allergic rhinitis, unspecified: Secondary | ICD-10-CM

## 2024-04-18 ENCOUNTER — Ambulatory Visit (INDEPENDENT_AMBULATORY_CARE_PROVIDER_SITE_OTHER): Payer: Self-pay

## 2024-04-18 DIAGNOSIS — J309 Allergic rhinitis, unspecified: Secondary | ICD-10-CM

## 2024-05-15 ENCOUNTER — Ambulatory Visit (INDEPENDENT_AMBULATORY_CARE_PROVIDER_SITE_OTHER)

## 2024-05-15 DIAGNOSIS — J309 Allergic rhinitis, unspecified: Secondary | ICD-10-CM

## 2024-06-12 ENCOUNTER — Ambulatory Visit (INDEPENDENT_AMBULATORY_CARE_PROVIDER_SITE_OTHER): Payer: Self-pay

## 2024-06-12 DIAGNOSIS — J309 Allergic rhinitis, unspecified: Secondary | ICD-10-CM

## 2024-06-15 NOTE — Patient Instructions (Incomplete)
 Other form of dyspnea-shortness of breath with mask use May use albuterol  2 puffs every 4 hours as needed for cough or wheeze You may use albuterol  2 puffs 5 to 15 minutes before activity to decrease cough or wheeze  Asthma control goals:  Full participation in all desired activities (may need albuterol  before activity) Albuterol  use two time or less a week on average (not counting use with activity) Cough interfering with sleep two time or less a month Oral steroids no more than once a year No hospitalizations   Allergic rhinitis Continue allergen avoidance measures directed toward grass pollen, weed pollen, mold, and dust mite as listed below Continue allergen immunotherapy and have access to an epinephrine  auto-injector set Continue cetirizine  10 mg once a day as needed for runny nose or itch Continue azelastine  1 to 2 sprays in each nostril twice a day as needed for runny nose Continue Atrovent  2 sprays in each nostril twice a day as needed for runny nose Continue Flonase  2 sprays in each nostril once a day as needed for stuffy nose Consider saline nasal rinses as needed for nasal symptoms. Use this before any medicated nasal sprays for best result   Allergic conjunctivitis Continue antihistamine eye drops as needed for red, itchy eyes. Some over the counter eye drops include Pataday  one drop in each eye once a day as needed for red, itchy eyes OR Zaditor one drop in each eye twice a day as needed for red itchy eyes.  Call the clinic if this treatment plan is not working well for you  Follow up in 12 months or sooner if needed.  Reducing Pollen Exposure The American Academy of Allergy, Asthma and Immunology suggests the following steps to reduce your exposure to pollen during allergy seasons. Do not hang sheets or clothing out to dry; pollen may collect on these items. Do not mow lawns or spend time around freshly cut grass; mowing stirs up pollen. Keep windows closed at night.   Keep car windows closed while driving. Minimize morning activities outdoors, a time when pollen counts are usually at their highest. Stay indoors as much as possible when pollen counts or humidity is high and on windy days when pollen tends to remain in the air longer. Use air conditioning when possible.  Many air conditioners have filters that trap the pollen spores. Use a HEPA room air filter to remove pollen form the indoor air you breathe.   Control of Dust Mite Allergen Dust mites play a major role in allergic asthma and rhinitis. They occur in environments with high humidity wherever human skin is found. Dust mites absorb humidity from the atmosphere (ie, they do not drink) and feed on organic matter (including shed human and animal skin). Dust mites are a microscopic type of insect that you cannot see with the naked eye. High levels of dust mites have been detected from mattresses, pillows, carpets, upholstered furniture, bed covers, clothes, soft toys and any woven material. The principal allergen of the dust mite is found in its feces. A gram of dust may contain 1,000 mites and 250,000 fecal particles. Mite antigen is easily measured in the air during house cleaning activities. Dust mites do not bite and do not cause harm to humans, other than by triggering allergies/asthma.  Ways to decrease your exposure to dust mites in your home:  1. Encase mattresses, box springs and pillows with a mite-impermeable barrier or cover  2. Wash sheets, blankets and drapes weekly in hot water (  130 F) with detergent and dry them in a dryer on the hot setting.  3. Have the room cleaned frequently with a vacuum cleaner and a damp dust-mop. For carpeting or rugs, vacuuming with a vacuum cleaner equipped with a high-efficiency particulate air (HEPA) filter. The dust mite allergic individual should not be in a room which is being cleaned and should wait 1 hour after cleaning before going into the room.  4. Do  not sleep on upholstered furniture (eg, couches).  5. If possible removing carpeting, upholstered furniture and drapery from the home is ideal. Horizontal blinds should be eliminated in the rooms where the person spends the most time (bedroom, study, television room). Washable vinyl, roller-type shades are optimal.  6. Remove all non-washable stuffed toys from the bedroom. Wash stuffed toys weekly like sheets and blankets above.  7. Reduce indoor humidity to less than 50%. Inexpensive humidity monitors can be purchased at most hardware stores. Do not use a humidifier as can make the problem worse and are not recommended.

## 2024-06-16 ENCOUNTER — Ambulatory Visit: Admitting: Family

## 2024-06-18 NOTE — Patient Instructions (Incomplete)
 Other form of dyspnea-shortness of breath with mask use May use albuterol  2 puffs every 4 hours as needed for cough or wheeze You may use albuterol  2 puffs 5 to 15 minutes before activity to decrease cough or wheeze  Asthma control goals:  Full participation in all desired activities (may need albuterol  before activity) Albuterol  use two time or less a week on average (not counting use with activity) Cough interfering with sleep two time or less a month Oral steroids no more than once a year No hospitalizations   Allergic rhinitis Continue allergen avoidance measures directed toward grass pollen, weed pollen, mold, and dust mite as listed below Continue allergen immunotherapy and have access to an epinephrine  auto-injector set Continue cetirizine  10 mg once a day as needed for runny nose or itch Continue azelastine  1 to 2 sprays in each nostril twice a day as needed for runny nose Continue Atrovent  2 sprays in each nostril twice a day as needed for runny nose Continue Flonase  2 sprays in each nostril once a day as needed for stuffy nose Consider saline nasal rinses as needed for nasal symptoms. Use this before any medicated nasal sprays for best result   Allergic conjunctivitis Continue antihistamine eye drops as needed for red, itchy eyes. Some over the counter eye drops include Pataday  one drop in each eye once a day as needed for red, itchy eyes OR Zaditor one drop in each eye twice a day as needed for red itchy eyes.  Call the clinic if this treatment plan is not working well for you  Follow up in 6-12  months or sooner if needed.  Reducing Pollen Exposure The American Academy of All[employer], Ast[employer] and Immun[employer] suggests the following steps to reduce your exposure to pollen during allergy seasons. Do not hang sheets or clothing out to dry; pollen may collect on these items. Do not mow lawns or spend time around freshly cut grass; mowing stirs up pollen. Keep windows closed at  night.  Keep car windows closed while driving. Minimize morning activities outdoors, a time when pollen counts are usually at their highest. Stay indoors as much as possible when pollen counts or humidity is high and on windy days when pollen tends to remain in the air longer. Use air conditioning when possible.  Many air conditioners have filters that trap the pollen spores. Use a HEPA room air filter to remove pollen form the indoor air you breathe.   Control of Dust Mite Allergen Dust mites play a major role in allergic asthma and rhinitis. They occur in environments with high humidity wherever human skin is found. Dust mites absorb humidity from the atmosphere (ie, they do not drink) and feed on organic matter (including shed human and animal skin). Dust mites are a microscopic type of insect that you cannot see with the naked eye. High levels of dust mites have been detected from mattresses, pillows, carpets, upholstered furniture, bed covers, clothes, soft toys and any woven material. The principal allergen of the dust mite is found in its feces. A gram of dust may contain 1,000 mites and 250,000 fecal particles. Mite antigen is easily measured in the air during house cleaning activities. Dust mites do not bite and do not cause harm to humans, other than by triggering allergies/asthma.  Ways to decrease your exposure to dust mites in your home:  1. Encase mattresses, box springs and pillows with a mite-impermeable barrier or cover  2. Wash sheets, blankets and drapes weekly in hot  water (130 F) with detergent and dry them in a dryer on the hot setting.  3. Have the room cleaned frequently with a vacuum cleaner and a damp dust-mop. For carpeting or rugs, vacuuming with a vacuum cleaner equipped with a high-efficiency particulate air (HEPA) filter. The dust mite allergic individual should not be in a room which is being cleaned and should wait 1 hour after cleaning before going into the  room.  4. Do not sleep on upholstered furniture (eg, couches).  5. If possible removing carpeting, upholstered furniture and drapery from the home is ideal. Horizontal blinds should be eliminated in the rooms where the person spends the most time (bedroom, study, television room). Washable vinyl, roller-type shades are optimal.  6. Remove all non-washable stuffed toys from the bedroom. Wash stuffed toys weekly like sheets and blankets above.  7. Reduce indoor humidity to less than 50%. Inexpensive humidity monitors can be purchased at most hardware stores. Do not use a humidifier as can make the problem worse and are not recommended.

## 2024-06-19 ENCOUNTER — Ambulatory Visit: Admitting: Family

## 2024-06-19 ENCOUNTER — Encounter: Payer: Self-pay | Admitting: Family

## 2024-06-19 VITALS — BP 110/74 | HR 66 | Temp 98.3°F | Resp 20 | Ht 67.0 in | Wt 142.1 lb

## 2024-06-19 DIAGNOSIS — J302 Other seasonal allergic rhinitis: Secondary | ICD-10-CM | POA: Diagnosis not present

## 2024-06-19 DIAGNOSIS — R0609 Other forms of dyspnea: Secondary | ICD-10-CM | POA: Diagnosis not present

## 2024-06-19 DIAGNOSIS — J3089 Other allergic rhinitis: Secondary | ICD-10-CM

## 2024-06-19 DIAGNOSIS — J4530 Mild persistent asthma, uncomplicated: Secondary | ICD-10-CM | POA: Diagnosis not present

## 2024-06-19 DIAGNOSIS — H1013 Acute atopic conjunctivitis, bilateral: Secondary | ICD-10-CM

## 2024-06-19 MED ORDER — ALBUTEROL SULFATE HFA 108 (90 BASE) MCG/ACT IN AERS: INHALATION_SPRAY | RESPIRATORY_TRACT | 1 refills | Status: AC

## 2024-06-19 MED ORDER — EPINEPHRINE 0.3 MG/0.3ML IJ SOAJ: INTRAMUSCULAR | 1 refills | Status: AC

## 2024-06-19 NOTE — Addendum Note (Signed)
 Addended by: ONEITA CHRISTIANS D on: 06/19/2024 02:55 PM   Modules accepted: Orders

## 2024-06-19 NOTE — Progress Notes (Signed)
 [address] [place] [address] Dept: [phone]  FOLLOW UP NOTE  Patient ID: Kelsey Benjamin, female    DOB: Nov 09, 1970  Age: 53 y.o. MRN: [id number] Date of Office Visit: 06/19/2024  Assessment  Chief Complaint: Follow-up (Follow up office visit 06/25/23 for allergic rhinitis, and dyspnea. Patient states she is doing well today just in office for follow up.)  HPI Kelsey Benjamin is a 53 year old female who presents today for follow-up of seasonal perennial allergic rhinitis, allergic conjunctivitis, and other form of dyspnea.  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  Dyspnea: She reports that sometimes she will feel like she needs to get a good breath but otherwise denies wheezing, tightness in chest, cough, and nocturnal awakenings due to breathing problems.  Since her last office visit she has received at least 1 round of steroids when she had walking pneumonia and possibly another round of steroids, but she cannot remember.  She has also received steroid injections in her right hand due to her arthritis.  She does not have albuterol  inhaler.  Allergic rhinitis: She reports that today she has a little bit of nasal congestion and she feels like her throat might be red.  She denies rhinorrhea and sore throat. She mentions that normally she is fine.  She continues to receive allergy injections per protocol and reports that they do help.  She is not interested in stopping allergy injections.  She has tried stopping 2 times and her symptoms returned.  She does take cetirizine  daily and uses a natural spray  from  [facility] containing colloidal silver and organic olive leaf.  She also uses another nasal spray containing colloidal silver, organic olive leaf extract, pure therapeutic grade essential oils: Peppermint, eucalyptus, and oregano.  She has stopped using  azelastine  nasal spray, Atrovent  nasal spray, Flonase  nasal spray because she was tired of taking so much medicine.  She has had 1 sinus infection since we last saw her.  Allergic conjunctivitis: She denies itchy watery eyes   Drug Allergies:  Allergies  Allergen Reactions   Doxycycline Rash    Review of Systems: Negative except as per HPI   Physical Exam: BP 110/74 (BP Location: Left Arm, Patient Position: Sitting, Cuff Size: Normal)   Pulse 66   Temp 98.3 F (36.8 C) (Oral)   Resp 20   Ht 5' 7 (1.702 m)   Wt 142 lb 1.6 oz (64.5 kg)   SpO2 97%   BMI 22.26 kg/m    Physical Exam Constitutional:      Appearance: Normal appearance.  HENT:     Head: Normocephalic and atraumatic.     Comments: Pharynx normal, eyes normal, ears normal, nose normal    Right Ear: Tympanic membrane, ear canal and external ear normal.     Left Ear: Tympanic membrane, ear canal and external ear normal.     Nose: Nose normal.     Mouth/Throat:     Mouth: Mucous membranes are moist.     Pharynx: Oropharynx is clear.  Eyes:     Conjunctiva/sclera: Conjunctivae normal.  Cardiovascular:     Rate and Rhythm: Regular rhythm.     Heart sounds: Normal heart sounds.  Pulmonary:     Effort: Pulmonary effort is normal.     Breath sounds: Normal breath sounds.     Comments: Lungs clear to auscultation Musculoskeletal:  Cervical back: Neck supple.  Skin:    General: Skin is warm.  Neurological:     Mental Status: She is alert and oriented to person, place, and time.  Psychiatric:        Mood and Affect: Mood normal.        Behavior: Behavior normal.        Thought Content: Thought content normal.        Judgment: Judgment normal.     Diagnostics: FVC 4.49 L (123%), FEV1 3.50 L (120%), FEV1/FVC 0.78.  Spirometry indicates normal spirometry.  Assessment and Plan: 1. Seasonal and perennial allergic rhinitis   2. Other form of dyspnea   3. Allergic conjunctivitis of both eyes     Meds  ordered this encounter  Medications   EPINEPHrine  (AUVI-Q ) 0.3 mg/0.3 mL IJ SOAJ injection    Sig: Use as directed for severe allergic reactions.    Dispense:  2 each    Refill:  1    [phone]   albuterol  (VENTOLIN  HFA) 108 (90 Base) MCG/ACT inhaler    Sig: Inhale 2 puffs every 4-6 hours as needed for cough, wheeze, tightness in chest, or shortness of breath    Dispense:  8 g    Refill:  1    Patient Instructions  Other form of dyspnea-shortness of breath with mask use May use albuterol  2 puffs every 4 hours as needed for cough or wheeze You may use albuterol  2 puffs 5 to 15 minutes before activity to decrease cough or wheeze  Asthma control goals:  Full participation in all desired activities (may need albuterol  before activity) Albuterol  use two time or less a week on average (not counting use with activity) Cough interfering with sleep two time or less a month Oral steroids no more than once a year No hospitalizations   Allergic rhinitis Continue allergen avoidance measures directed toward grass pollen, weed pollen, mold, and dust mite as listed below Continue allergen immunotherapy and have access to an epinephrine  auto-injector set Continue cetirizine  10 mg once a day as needed for runny nose or itch Continue azelastine  1 to 2 sprays in each nostril twice a day as needed for runny nose Continue Atrovent  2 sprays in each nostril twice a day as needed for runny nose Continue Flonase  2 sprays in each nostril once a day as needed for stuffy nose Consider saline nasal rinses as needed for nasal symptoms. Use this before any medicated nasal sprays for best result   Allergic conjunctivitis Continue antihistamine eye drops as needed for red, itchy eyes. Some over the counter eye drops include Pataday  one drop in each eye once a day as needed for red, itchy eyes OR Zaditor one drop in each eye twice a day as needed for red itchy eyes.  Call the clinic if this treatment plan is  not working well for you  Follow up in 6-12  months or sooner if needed.  Reducing Pollen Exposure The American Academy of Allergy, Asthma and Immunology suggests the following steps to reduce your exposure to pollen during allergy seasons. Do not hang sheets or clothing out to dry; pollen may collect on these items. Do not mow lawns or spend time around freshly cut grass; mowing stirs up pollen. Keep windows closed at night.  Keep car windows closed while driving. Minimize morning activities outdoors, a time when pollen counts are usually at their highest. Stay indoors as much as possible when pollen counts or humidity is high and on windy  days when pollen tends to remain in the air longer. Use air conditioning when possible.  Many air conditioners have filters that trap the pollen spores. Use a HEPA room air filter to remove pollen form the indoor air you breathe.   Control of Dust Mite Allergen Dust mites play a major role in allergic asthma and rhinitis. They occur in environments with high humidity wherever human skin is found. Dust mites absorb humidity from the atmosphere (ie, they do not drink) and feed on organic matter (including shed human and animal skin). Dust mites are a microscopic type of insect that you cannot see with the naked eye. High levels of dust mites have been detected from mattresses, pillows, carpets, upholstered furniture, bed covers, clothes, soft toys and any woven material. The principal allergen of the dust mite is found in its feces. A gram of dust may contain 1,000 mites and 250,000 fecal particles. Mite antigen is easily measured in the air during house cleaning activities. Dust mites do not bite and do not cause harm to humans, other than by triggering allergies/asthma.  Ways to decrease your exposure to dust mites in your home:  1. Encase mattresses, box springs and pillows with a mite-impermeable barrier or cover  2. Wash sheets, blankets and drapes weekly  in hot water (130 F) with detergent and dry them in a dryer on the hot setting.  3. Have the room cleaned frequently with a vacuum cleaner and a damp dust-mop. For carpeting or rugs, vacuuming with a vacuum cleaner equipped with a high-efficiency particulate air (HEPA) filter. The dust mite allergic individual should not be in a room which is being cleaned and should wait 1 hour after cleaning before going into the room.  4. Do not sleep on upholstered furniture (eg, couches).  5. If possible removing carpeting, upholstered furniture and drapery from the home is ideal. Horizontal blinds should be eliminated in the rooms where the person spends the most time (bedroom, study, television room). Washable vinyl, roller-type shades are optimal.  6. Remove all non-washable stuffed toys from the bedroom. Wash stuffed toys weekly like sheets and blankets above.  7. Reduce indoor humidity to less than 50%. Inexpensive humidity monitors can be purchased at most hardware stores. Do not use a humidifier as can make the problem worse and are not recommended.  Return in about 6 months (around 12/18/2024), or if symptoms worsen or fail to improve.    Thank you for the opportunity to care for this patient.  Please do not hesitate to contact me with questions.  Wanda Craze, FNP [facility] 

## 2024-07-14 ENCOUNTER — Ambulatory Visit: Payer: Self-pay

## 2024-07-14 DIAGNOSIS — J302 Other seasonal allergic rhinitis: Secondary | ICD-10-CM | POA: Diagnosis not present

## 2024-07-14 DIAGNOSIS — J3089 Other allergic rhinitis: Secondary | ICD-10-CM | POA: Diagnosis not present

## 2024-08-07 ENCOUNTER — Ambulatory Visit

## 2024-08-07 DIAGNOSIS — J309 Allergic rhinitis, unspecified: Secondary | ICD-10-CM

## 2024-09-11 ENCOUNTER — Ambulatory Visit (INDEPENDENT_AMBULATORY_CARE_PROVIDER_SITE_OTHER)

## 2024-09-11 DIAGNOSIS — J302 Other seasonal allergic rhinitis: Secondary | ICD-10-CM | POA: Diagnosis not present
